# Patient Record
Sex: Female | Born: 1935 | Race: Black or African American | Hispanic: No | Smoking: Former smoker
Health system: Southern US, Community
[De-identification: ages and names within clinical notes are randomized; demographics above are authoritative.]

## PROBLEM LIST (undated history)

## (undated) DIAGNOSIS — G309 Alzheimer's disease, unspecified: Secondary | ICD-10-CM

## (undated) DIAGNOSIS — M1990 Unspecified osteoarthritis, unspecified site: Secondary | ICD-10-CM

## (undated) DIAGNOSIS — F0280 Dementia in other diseases classified elsewhere without behavioral disturbance: Secondary | ICD-10-CM

## (undated) DIAGNOSIS — I10 Essential (primary) hypertension: Secondary | ICD-10-CM

## (undated) HISTORY — PX: CHOLECYSTECTOMY: SHX55

## (undated) HISTORY — PX: BREAST SURGERY: SHX581

---

## 2001-08-05 ENCOUNTER — Other Ambulatory Visit: Admission: RE | Admit: 2001-08-05 | Discharge: 2001-08-05 | Payer: Self-pay | Admitting: Family Medicine

## 2002-09-20 ENCOUNTER — Encounter: Payer: Self-pay | Admitting: Family Medicine

## 2002-09-20 ENCOUNTER — Ambulatory Visit (HOSPITAL_COMMUNITY): Admission: RE | Admit: 2002-09-20 | Discharge: 2002-09-20 | Payer: Self-pay | Admitting: Family Medicine

## 2003-11-13 ENCOUNTER — Ambulatory Visit (HOSPITAL_COMMUNITY): Admission: RE | Admit: 2003-11-13 | Discharge: 2003-11-13 | Payer: Self-pay | Admitting: Family Medicine

## 2004-06-09 ENCOUNTER — Ambulatory Visit (HOSPITAL_COMMUNITY): Admission: RE | Admit: 2004-06-09 | Discharge: 2004-06-09 | Payer: Self-pay | Admitting: Family Medicine

## 2005-07-27 ENCOUNTER — Ambulatory Visit (HOSPITAL_COMMUNITY): Admission: RE | Admit: 2005-07-27 | Discharge: 2005-07-27 | Payer: Self-pay | Admitting: Family Medicine

## 2006-07-29 ENCOUNTER — Ambulatory Visit (HOSPITAL_COMMUNITY): Admission: RE | Admit: 2006-07-29 | Discharge: 2006-07-29 | Payer: Self-pay | Admitting: Family Medicine

## 2007-08-04 ENCOUNTER — Ambulatory Visit (HOSPITAL_COMMUNITY): Admission: RE | Admit: 2007-08-04 | Discharge: 2007-08-04 | Payer: Self-pay | Admitting: Family Medicine

## 2008-08-08 ENCOUNTER — Ambulatory Visit (HOSPITAL_COMMUNITY): Admission: RE | Admit: 2008-08-08 | Discharge: 2008-08-08 | Payer: Self-pay | Admitting: Family Medicine

## 2008-10-11 ENCOUNTER — Ambulatory Visit (HOSPITAL_COMMUNITY): Admission: RE | Admit: 2008-10-11 | Discharge: 2008-10-11 | Payer: Self-pay | Admitting: Gastroenterology

## 2008-10-11 ENCOUNTER — Ambulatory Visit: Payer: Self-pay | Admitting: Gastroenterology

## 2008-10-12 ENCOUNTER — Ambulatory Visit: Payer: Self-pay | Admitting: Gastroenterology

## 2008-10-12 ENCOUNTER — Ambulatory Visit (HOSPITAL_COMMUNITY): Admission: RE | Admit: 2008-10-12 | Discharge: 2008-10-12 | Payer: Self-pay | Admitting: Gastroenterology

## 2008-10-12 ENCOUNTER — Encounter: Payer: Self-pay | Admitting: Gastroenterology

## 2009-04-02 ENCOUNTER — Ambulatory Visit (HOSPITAL_COMMUNITY): Admission: RE | Admit: 2009-04-02 | Discharge: 2009-04-02 | Payer: Self-pay | Admitting: Family Medicine

## 2009-08-01 ENCOUNTER — Ambulatory Visit (HOSPITAL_COMMUNITY): Admission: RE | Admit: 2009-08-01 | Discharge: 2009-08-01 | Payer: Self-pay | Admitting: Family Medicine

## 2009-08-19 ENCOUNTER — Ambulatory Visit (HOSPITAL_COMMUNITY): Admission: RE | Admit: 2009-08-19 | Discharge: 2009-08-19 | Payer: Self-pay | Admitting: Family Medicine

## 2009-12-31 ENCOUNTER — Ambulatory Visit (HOSPITAL_COMMUNITY): Admission: RE | Admit: 2009-12-31 | Discharge: 2009-12-31 | Payer: Self-pay | Admitting: Family Medicine

## 2010-08-21 ENCOUNTER — Ambulatory Visit (HOSPITAL_COMMUNITY): Admission: RE | Admit: 2010-08-21 | Discharge: 2010-08-21 | Payer: Self-pay | Admitting: Family Medicine

## 2011-05-12 NOTE — Op Note (Signed)
[name], [name]                  ACCOUNT NO.:  [id number]   MEDICAL RECORD NO.:  [id number]          PATIENT TYPE:  AMB   LOCATION:  DAY                           FACILITY:  [facility]   PHYSICIAN:  Kassie Mends, M.D.      DATE OF BIRTH:  04/25/1925   DATE OF PROCEDURE:  10/11/2008  DATE OF DISCHARGE:                               OPERATIVE REPORT   REFERRING PHYSICIAN:  Angus G. McInnis, MD   PROCEDURE:  Sigmoidoscopy due to poor bowel prep.   INDICATIONS FOR PROCEDURE:  [name] is an 77 year old female who  presents for average risk colon cancer screening.   FINDINGS:  1. Formed stool seen 30 cm from the anal verge.  2. Frequent large mouth diverticula.  Otherwise no polyps, masses or      inflammatory changes seen.  3. Normal retroflexed view of the rectum.   DIAGNOSES:  Moderate diverticulosis.   RECOMMENDATIONS:  1. She should return for colonoscopy tomorrow after completing an      additional day of MiraLax bowel prep.  2. She should continue to have clear liquid diet for 24 hours and then      nothing after her last dose of MiraLax.  3. She is to take MiraLax 17 grams in 8 ounces of water every hour for      6 hours.  She should wait for 2 hours and then repeat for 4 hours.   MEDICATIONS:  1. Demerol 75 mg IV.  2. Versed 3 mg IV.   PROCEDURE TECHNIQUE:  Physical exam was performed.  Informed consent was  obtained from the patient after explaining benefits, risks and  alternatives to the procedure.  The patient was connected to the monitor  and placed in left lateral position.  Continuous oxygen was provided by  nasal cannula.  IV medicine administered through an indwelling cannula.  After administration of sedation and rectal exam, the patient's rectum  was intubated.  The scope  was advanced under direct visualization to the cecum.  The scope was  removed slowly by carefully examining the color, texture, anatomy and  integrity of the mucosa on the way out.  The  patient was recovered in  endoscopy and discharged home in satisfactory condition.      Kassie Mends, M.D.  Electronically Signed     SM/MEDQ  D:  10/11/2008  T:  10/11/2008  Job:  [id number]   cc:   Angus G. Renard Matter, MD  Fax: [phone]

## 2011-05-12 NOTE — Op Note (Signed)
[name], [name]                  ACCOUNT NO.:  [id number]   MEDICAL RECORD NO.:  [id number]          PATIENT TYPE:  AMB   LOCATION:  DAY                           FACILITY:  [facility]   PHYSICIAN:  Kassie Mends, M.D.      DATE OF BIRTH:  12/24/1925   DATE OF PROCEDURE:  10/12/2008  DATE OF DISCHARGE:                               OPERATIVE REPORT   REFERRING PHYSICIAN:  Angus G. McInnis, MD   PROCEDURE:  Colonoscopy with cold forceps and cold snare polypectomy.   INDICATION FOR EXAM:  [name] is an 77 year old female who has never  had a colonoscopy.  She presents for colon cancer screening.   FINDINGS:  1. A 3-4 mm sessile sigmoid colon polyp removed, via cold snare.  A 6-      mm slightly pedunculated sigmoid colon polyp removed, via snare      cautery.  The base bled after the polyp was removed in piecemeal      fashion with cold forceps initially. She had a moderate amount of      bleeding after the cold forceps biopsies.  The base of the polyp      was snared using cautery, and hemostasis was achieved.  2. Frequent descending colon and sigmoid colon diverticula.  A      thickened fold was seen in the sigmoid colon.  The polyp is less      than 1 cm, could have been easily missed.  The scope was carefully      withdrawn in an attempt to get a comprehensive look at her sigmoid      colon.  Otherwise, no masses, inflammatory changes, or AVMs seen.  3. Normal retroflexed view of the rectum.   DIAGNOSES:  1. Sigmoid colon polyps.  2. Moderate left-sided diverticulosis.   RECOMMENDATIONS:  1. Screening colonoscopy in 10-15 years if she remains healthy.  2. No aspirin until October 20, 2008.  No anti-inflammatory drugs or      anticoagulation for 7 days.  3. She should follow a high-fiber diet.  She was given a handout on      high-fiber diet, polyps, and diverticulosis.   MEDICATIONS:  1. Demerol 50 mg IV.  2. Versed 3 mg IV.   PROCEDURE TECHNIQUE:  Physical exam was  performed.  Informed consent was  obtained from the patient explaining the benefits, risks, and  alternatives to the procedure.  The patient was connected to the monitor  and placed in the left lateral position.  Continuous oxygen was provided  by nasal cannula and IV medicine administered through an indwelling  cannula.  After administration of sedation and rectal exam, the  patient's rectum was intubated and the scope was advanced under direct  visualization to the cecum.  The scope was removed slowly by carefully  examining the color, texture, anatomy, and integrity of the mucosa on  the way out.  The patient was recovered in endoscopy and discharged home  in satisfactory condition.   PATH:  TWO TUBULAR ADENOMAS      Kassie Mends, M.D.  Electronically Signed     SM/MEDQ  D:  10/12/2008  T:  10/13/2008  Job:  [id number]   cc:   Angus G. Renard Matter, MD  Fax: [phone]

## 2011-09-02 ENCOUNTER — Other Ambulatory Visit (HOSPITAL_COMMUNITY): Payer: Self-pay | Admitting: Family Medicine

## 2011-09-02 DIAGNOSIS — Z139 Encounter for screening, unspecified: Secondary | ICD-10-CM

## 2011-09-10 ENCOUNTER — Ambulatory Visit (HOSPITAL_COMMUNITY)
Admission: RE | Admit: 2011-09-10 | Discharge: 2011-09-10 | Disposition: A | Discharge status: Home / Self Care | Payer: Medicare Other | Source: Ambulatory Visit | Attending: Family Medicine | Admitting: Family Medicine

## 2011-09-10 DIAGNOSIS — Z1231 Encounter for screening mammogram for malignant neoplasm of breast: Secondary | ICD-10-CM | POA: Insufficient documentation

## 2011-09-10 DIAGNOSIS — Z139 Encounter for screening, unspecified: Secondary | ICD-10-CM

## 2012-08-04 ENCOUNTER — Other Ambulatory Visit (HOSPITAL_COMMUNITY): Payer: Self-pay | Admitting: Family Medicine

## 2012-08-04 DIAGNOSIS — Z139 Encounter for screening, unspecified: Secondary | ICD-10-CM

## 2012-09-12 ENCOUNTER — Ambulatory Visit (HOSPITAL_COMMUNITY)
Admission: RE | Admit: 2012-09-12 | Discharge: 2012-09-12 | Disposition: A | Discharge status: Home / Self Care | Payer: Medicare Other | Source: Ambulatory Visit | Attending: Family Medicine | Admitting: Family Medicine

## 2012-09-12 DIAGNOSIS — Z139 Encounter for screening, unspecified: Secondary | ICD-10-CM

## 2012-09-12 DIAGNOSIS — Z1231 Encounter for screening mammogram for malignant neoplasm of breast: Secondary | ICD-10-CM | POA: Insufficient documentation

## 2013-09-05 ENCOUNTER — Other Ambulatory Visit (HOSPITAL_COMMUNITY): Payer: Self-pay | Admitting: Family Medicine

## 2013-09-05 DIAGNOSIS — Z139 Encounter for screening, unspecified: Secondary | ICD-10-CM

## 2013-09-14 ENCOUNTER — Ambulatory Visit (HOSPITAL_COMMUNITY)
Admission: RE | Admit: 2013-09-14 | Discharge: 2013-09-14 | Disposition: A | Discharge status: Home / Self Care | Payer: Medicare Other | Source: Ambulatory Visit | Attending: Family Medicine | Admitting: Family Medicine

## 2013-09-14 DIAGNOSIS — Z1231 Encounter for screening mammogram for malignant neoplasm of breast: Secondary | ICD-10-CM | POA: Insufficient documentation

## 2013-09-14 DIAGNOSIS — Z139 Encounter for screening, unspecified: Secondary | ICD-10-CM

## 2015-06-28 ENCOUNTER — Other Ambulatory Visit (HOSPITAL_COMMUNITY): Payer: Self-pay | Admitting: Family Medicine

## 2015-06-28 DIAGNOSIS — Z1231 Encounter for screening mammogram for malignant neoplasm of breast: Secondary | ICD-10-CM

## 2015-07-05 ENCOUNTER — Other Ambulatory Visit (HOSPITAL_COMMUNITY): Payer: Self-pay | Admitting: Family Medicine

## 2015-07-05 ENCOUNTER — Ambulatory Visit (HOSPITAL_COMMUNITY)
Admission: RE | Admit: 2015-07-05 | Discharge: 2015-07-05 | Disposition: A | Discharge status: Home / Self Care | Payer: Medicare Other | Source: Ambulatory Visit | Attending: Family Medicine | Admitting: Family Medicine

## 2015-07-05 DIAGNOSIS — Z1231 Encounter for screening mammogram for malignant neoplasm of breast: Secondary | ICD-10-CM

## 2016-02-12 ENCOUNTER — Emergency Department (HOSPITAL_COMMUNITY)
Admission: EM | Admit: 2016-02-12 | Discharge: 2016-02-12 | Disposition: A | Discharge status: Home / Self Care | Payer: Medicare Other | Attending: Emergency Medicine | Admitting: Emergency Medicine

## 2016-02-12 ENCOUNTER — Encounter (HOSPITAL_COMMUNITY): Payer: Self-pay | Admitting: Emergency Medicine

## 2016-02-12 ENCOUNTER — Emergency Department (HOSPITAL_COMMUNITY): Payer: Medicare Other

## 2016-02-12 DIAGNOSIS — Z87891 Personal history of nicotine dependence: Secondary | ICD-10-CM | POA: Insufficient documentation

## 2016-02-12 DIAGNOSIS — Z9049 Acquired absence of other specified parts of digestive tract: Secondary | ICD-10-CM | POA: Insufficient documentation

## 2016-02-12 DIAGNOSIS — Z791 Long term (current) use of non-steroidal anti-inflammatories (NSAID): Secondary | ICD-10-CM | POA: Diagnosis not present

## 2016-02-12 DIAGNOSIS — F0280 Dementia in other diseases classified elsewhere without behavioral disturbance: Secondary | ICD-10-CM | POA: Insufficient documentation

## 2016-02-12 DIAGNOSIS — K5900 Constipation, unspecified: Secondary | ICD-10-CM | POA: Diagnosis not present

## 2016-02-12 DIAGNOSIS — I10 Essential (primary) hypertension: Secondary | ICD-10-CM | POA: Diagnosis not present

## 2016-02-12 DIAGNOSIS — G309 Alzheimer's disease, unspecified: Secondary | ICD-10-CM | POA: Diagnosis not present

## 2016-02-12 DIAGNOSIS — Z7982 Long term (current) use of aspirin: Secondary | ICD-10-CM | POA: Insufficient documentation

## 2016-02-12 DIAGNOSIS — R52 Pain, unspecified: Secondary | ICD-10-CM

## 2016-02-12 DIAGNOSIS — M1990 Unspecified osteoarthritis, unspecified site: Secondary | ICD-10-CM | POA: Insufficient documentation

## 2016-02-12 DIAGNOSIS — Z79899 Other long term (current) drug therapy: Secondary | ICD-10-CM | POA: Insufficient documentation

## 2016-02-12 DIAGNOSIS — R1032 Left lower quadrant pain: Secondary | ICD-10-CM | POA: Diagnosis present

## 2016-02-12 HISTORY — DX: Unspecified osteoarthritis, unspecified site: M19.90

## 2016-02-12 HISTORY — DX: Alzheimer's disease, unspecified: G30.9

## 2016-02-12 HISTORY — DX: Dementia in other diseases classified elsewhere, unspecified severity, without behavioral disturbance, psychotic disturbance, mood disturbance, and anxiety: F02.80

## 2016-02-12 HISTORY — DX: Essential (primary) hypertension: I10

## 2016-02-12 LAB — COMPREHENSIVE METABOLIC PANEL
ALBUMIN: 3.9 g/dL (ref 3.5–5.0)
ALK PHOS: 95 U/L (ref 38–126)
ALT: 29 U/L (ref 14–54)
AST: 40 U/L (ref 15–41)
Anion gap: 9 (ref 5–15)
BILIRUBIN TOTAL: 0.7 mg/dL (ref 0.3–1.2)
BUN: 40 mg/dL — AB (ref 6–20)
CO2: 29 mmol/L (ref 22–32)
CREATININE: 1.51 mg/dL — AB (ref 0.44–1.00)
Calcium: 9.4 mg/dL (ref 8.9–10.3)
Chloride: 103 mmol/L (ref 101–111)
GFR calc Af Amer: 34 mL/min — ABNORMAL LOW (ref >60)
GFR, EST NON AFRICAN AMERICAN: 29 mL/min — AB (ref >60)
GLUCOSE: 81 mg/dL (ref 65–99)
Potassium: 4.4 mmol/L (ref 3.5–5.1)
Sodium: 141 mmol/L (ref 135–145)
TOTAL PROTEIN: 7.4 g/dL (ref 6.5–8.1)

## 2016-02-12 LAB — CBC WITH DIFFERENTIAL/PLATELET
BASOS ABS: 0 10*3/uL (ref 0.0–0.1)
BASOS PCT: 1 %
Eosinophils Absolute: 0.2 10*3/uL (ref 0.0–0.7)
Eosinophils Relative: 4 %
HEMATOCRIT: 41.4 % (ref 36.0–46.0)
HEMOGLOBIN: 13.2 g/dL (ref 12.0–15.0)
LYMPHS ABS: 2 10*3/uL (ref 0.7–4.0)
LYMPHS PCT: 33 %
MCH: 28.8 pg (ref 26.0–34.0)
MCHC: 31.9 g/dL (ref 30.0–36.0)
MCV: 90.2 fL (ref 78.0–100.0)
MONO ABS: 0.5 10*3/uL (ref 0.1–1.0)
MONOS PCT: 9 %
NEUTROS ABS: 3.2 10*3/uL (ref 1.7–7.7)
Neutrophils Relative %: 53 %
Platelets: 222 10*3/uL (ref 150–400)
RBC: 4.59 MIL/uL (ref 3.87–5.11)
RDW: 13.9 % (ref 11.5–15.5)
WBC: 5.9 10*3/uL (ref 4.0–10.5)

## 2016-02-12 MED ORDER — SODIUM CHLORIDE 0.9 % IV BOLUS (SEPSIS)
500.0000 mL | Freq: Once | INTRAVENOUS | Status: AC
  Administered 2016-02-12: 500 mL via INTRAVENOUS

## 2016-02-12 MED ORDER — DIATRIZOATE MEGLUMINE & SODIUM 66-10 % PO SOLN
ORAL | Status: AC
  Filled 2016-02-12: qty 30

## 2016-02-12 NOTE — ED Notes (Signed)
Pt c/o of generalized abdominal pain and constipation since Monday. Pt's family attempted to give her an enema with no relief. States stool was minimal and hard. Denies n/v/d.
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
## 2016-02-12 NOTE — Discharge Instructions (Signed)
Use a laxative like to clots or milk of magnesia and follow-up with her doctor

## 2016-02-12 NOTE — ED Notes (Signed)
Patient drinking po contrast. Tolerating well. 

## 2016-07-08 ENCOUNTER — Other Ambulatory Visit (HOSPITAL_COMMUNITY): Payer: Self-pay | Admitting: Internal Medicine

## 2016-07-08 DIAGNOSIS — Z780 Asymptomatic menopausal state: Secondary | ICD-10-CM

## 2016-07-13 ENCOUNTER — Ambulatory Visit (HOSPITAL_COMMUNITY)
Admission: RE | Admit: 2016-07-13 | Discharge: 2016-07-13 | Disposition: A | Discharge status: Home / Self Care | Payer: Medicare Other | Source: Ambulatory Visit | Attending: Internal Medicine | Admitting: Internal Medicine

## 2016-07-13 DIAGNOSIS — Z780 Asymptomatic menopausal state: Secondary | ICD-10-CM | POA: Diagnosis not present

## 2017-03-29 ENCOUNTER — Other Ambulatory Visit (HOSPITAL_COMMUNITY): Payer: Self-pay | Admitting: Internal Medicine

## 2017-03-29 DIAGNOSIS — N179 Acute kidney failure, unspecified: Secondary | ICD-10-CM

## 2017-04-05 ENCOUNTER — Ambulatory Visit (HOSPITAL_COMMUNITY): Admission: RE | Admit: 2017-04-05 | Payer: Medicare Other | Source: Ambulatory Visit

## 2017-04-07 ENCOUNTER — Ambulatory Visit (HOSPITAL_COMMUNITY)
Admission: RE | Admit: 2017-04-07 | Discharge: 2017-04-07 | Disposition: A | Discharge status: Home / Self Care | Payer: Medicare Other | Source: Ambulatory Visit | Attending: Internal Medicine | Admitting: Internal Medicine

## 2017-04-07 DIAGNOSIS — R140 Abdominal distension (gaseous): Secondary | ICD-10-CM | POA: Insufficient documentation

## 2017-04-07 DIAGNOSIS — N281 Cyst of kidney, acquired: Secondary | ICD-10-CM | POA: Diagnosis not present

## 2017-04-07 DIAGNOSIS — N179 Acute kidney failure, unspecified: Secondary | ICD-10-CM | POA: Insufficient documentation

## 2017-05-11 ENCOUNTER — Emergency Department (HOSPITAL_COMMUNITY): Payer: Medicare Other

## 2017-05-11 ENCOUNTER — Encounter (HOSPITAL_COMMUNITY): Payer: Self-pay | Admitting: Emergency Medicine

## 2017-05-11 ENCOUNTER — Inpatient Hospital Stay (HOSPITAL_COMMUNITY)
Admission: EM | Admit: 2017-05-11 | Discharge: 2017-05-17 | DRG: 195 | Disposition: A | Discharge status: Skilled Nursing Facility | Payer: Medicare Other | Attending: Internal Medicine | Admitting: Internal Medicine

## 2017-05-11 DIAGNOSIS — Z9049 Acquired absence of other specified parts of digestive tract: Secondary | ICD-10-CM

## 2017-05-11 DIAGNOSIS — N183 Chronic kidney disease, stage 3 (moderate): Secondary | ICD-10-CM | POA: Diagnosis present

## 2017-05-11 DIAGNOSIS — R531 Weakness: Secondary | ICD-10-CM

## 2017-05-11 DIAGNOSIS — R630 Anorexia: Secondary | ICD-10-CM

## 2017-05-11 DIAGNOSIS — Z87891 Personal history of nicotine dependence: Secondary | ICD-10-CM

## 2017-05-11 DIAGNOSIS — J189 Pneumonia, unspecified organism: Secondary | ICD-10-CM | POA: Diagnosis present

## 2017-05-11 DIAGNOSIS — I10 Essential (primary) hypertension: Secondary | ICD-10-CM | POA: Diagnosis present

## 2017-05-11 DIAGNOSIS — R05 Cough: Secondary | ICD-10-CM

## 2017-05-11 DIAGNOSIS — M65812 Other synovitis and tenosynovitis, left shoulder: Secondary | ICD-10-CM | POA: Diagnosis present

## 2017-05-11 DIAGNOSIS — R509 Fever, unspecified: Secondary | ICD-10-CM | POA: Diagnosis not present

## 2017-05-11 DIAGNOSIS — I129 Hypertensive chronic kidney disease with stage 1 through stage 4 chronic kidney disease, or unspecified chronic kidney disease: Secondary | ICD-10-CM | POA: Diagnosis present

## 2017-05-11 DIAGNOSIS — M75122 Complete rotator cuff tear or rupture of left shoulder, not specified as traumatic: Secondary | ICD-10-CM | POA: Diagnosis not present

## 2017-05-11 DIAGNOSIS — Z66 Do not resuscitate: Secondary | ICD-10-CM | POA: Diagnosis present

## 2017-05-11 DIAGNOSIS — E039 Hypothyroidism, unspecified: Secondary | ICD-10-CM | POA: Diagnosis present

## 2017-05-11 DIAGNOSIS — F0280 Dementia in other diseases classified elsewhere without behavioral disturbance: Secondary | ICD-10-CM | POA: Diagnosis present

## 2017-05-11 DIAGNOSIS — M25412 Effusion, left shoulder: Secondary | ICD-10-CM

## 2017-05-11 DIAGNOSIS — E860 Dehydration: Secondary | ICD-10-CM | POA: Diagnosis present

## 2017-05-11 DIAGNOSIS — M719 Bursopathy, unspecified: Secondary | ICD-10-CM | POA: Diagnosis not present

## 2017-05-11 DIAGNOSIS — Z7982 Long term (current) use of aspirin: Secondary | ICD-10-CM | POA: Diagnosis not present

## 2017-05-11 DIAGNOSIS — R059 Cough, unspecified: Secondary | ICD-10-CM

## 2017-05-11 DIAGNOSIS — M25512 Pain in left shoulder: Secondary | ICD-10-CM

## 2017-05-11 DIAGNOSIS — N189 Chronic kidney disease, unspecified: Secondary | ICD-10-CM | POA: Diagnosis present

## 2017-05-11 DIAGNOSIS — Z79899 Other long term (current) drug therapy: Secondary | ICD-10-CM

## 2017-05-11 DIAGNOSIS — M67919 Unspecified disorder of synovium and tendon, unspecified shoulder: Secondary | ICD-10-CM | POA: Diagnosis not present

## 2017-05-11 DIAGNOSIS — G309 Alzheimer's disease, unspecified: Secondary | ICD-10-CM | POA: Diagnosis present

## 2017-05-11 DIAGNOSIS — M1990 Unspecified osteoarthritis, unspecified site: Secondary | ICD-10-CM | POA: Diagnosis present

## 2017-05-11 LAB — CBC WITH DIFFERENTIAL/PLATELET
BASOS PCT: 0 %
Basophils Absolute: 0 10*3/uL (ref 0.0–0.1)
EOS ABS: 0 10*3/uL (ref 0.0–0.7)
EOS PCT: 0 %
HCT: 39 % (ref 36.0–46.0)
HEMOGLOBIN: 12.7 g/dL (ref 12.0–15.0)
Lymphocytes Relative: 7 %
Lymphs Abs: 0.7 10*3/uL (ref 0.7–4.0)
MCH: 28.7 pg (ref 26.0–34.0)
MCHC: 32.6 g/dL (ref 30.0–36.0)
MCV: 88.2 fL (ref 78.0–100.0)
MONO ABS: 1.2 10*3/uL — AB (ref 0.1–1.0)
MONOS PCT: 10 %
NEUTROS PCT: 83 %
Neutro Abs: 9.3 10*3/uL — ABNORMAL HIGH (ref 1.7–7.7)
PLATELETS: 299 10*3/uL (ref 150–400)
RBC: 4.42 MIL/uL (ref 3.87–5.11)
RDW: 14 % (ref 11.5–15.5)
WBC: 11.2 10*3/uL — ABNORMAL HIGH (ref 4.0–10.5)

## 2017-05-11 LAB — COMPREHENSIVE METABOLIC PANEL
ALBUMIN: 3.6 g/dL (ref 3.5–5.0)
ALK PHOS: 66 U/L (ref 38–126)
ALT: 28 U/L (ref 14–54)
ANION GAP: 11 (ref 5–15)
AST: 39 U/L (ref 15–41)
BILIRUBIN TOTAL: 1 mg/dL (ref 0.3–1.2)
BUN: 24 mg/dL — ABNORMAL HIGH (ref 6–20)
CALCIUM: 9.6 mg/dL (ref 8.9–10.3)
CO2: 30 mmol/L (ref 22–32)
CREATININE: 1.53 mg/dL — AB (ref 0.44–1.00)
Chloride: 103 mmol/L (ref 101–111)
GFR calc Af Amer: 33 mL/min — ABNORMAL LOW (ref >60)
GFR calc non Af Amer: 29 mL/min — ABNORMAL LOW (ref >60)
GLUCOSE: 162 mg/dL — AB (ref 65–99)
Potassium: 3.5 mmol/L (ref 3.5–5.1)
Sodium: 144 mmol/L (ref 135–145)
TOTAL PROTEIN: 7.7 g/dL (ref 6.5–8.1)

## 2017-05-11 LAB — LIPASE, BLOOD: Lipase: 28 U/L (ref 11–51)

## 2017-05-11 LAB — URINALYSIS, ROUTINE W REFLEX MICROSCOPIC
Bacteria, UA: NONE SEEN
Bilirubin Urine: NEGATIVE
GLUCOSE, UA: NEGATIVE mg/dL
Ketones, ur: NEGATIVE mg/dL
Leukocytes, UA: NEGATIVE
NITRITE: NEGATIVE
Protein, ur: NEGATIVE mg/dL
SPECIFIC GRAVITY, URINE: 1.013 (ref 1.005–1.030)
pH: 5 (ref 5.0–8.0)

## 2017-05-11 LAB — TROPONIN I
TROPONIN I: 0.03 ng/mL — AB (ref <0.03)
Troponin I: 0.04 ng/mL (ref <0.03)
Troponin I: 0.05 ng/mL (ref <0.03)

## 2017-05-11 LAB — LACTIC ACID, PLASMA: Lactic Acid, Venous: 1.3 mmol/L (ref 0.5–1.9)

## 2017-05-11 MED ORDER — DICLOFENAC SODIUM 1 % TD GEL
TRANSDERMAL | Status: AC
  Filled 2017-05-11: qty 100

## 2017-05-11 MED ORDER — VANCOMYCIN HCL IN DEXTROSE 1-5 GM/200ML-% IV SOLN
1000.0000 mg | Freq: Once | INTRAVENOUS | Status: DC
  Filled 2017-05-11: qty 200

## 2017-05-11 MED ORDER — SODIUM CHLORIDE 0.9 % IV BOLUS (SEPSIS)
500.0000 mL | Freq: Once | INTRAVENOUS | Status: AC
  Administered 2017-05-11: 500 mL via INTRAVENOUS

## 2017-05-11 MED ORDER — MEMANTINE HCL 10 MG PO TABS
5.0000 mg | ORAL_TABLET | Freq: Every day | ORAL | Status: DC
  Administered 2017-05-11 – 2017-05-17 (×6): 5 mg via ORAL
  Filled 2017-05-11 (×6): qty 1

## 2017-05-11 MED ORDER — DICLOFENAC SODIUM 1 % TD GEL
4.0000 g | Freq: Four times a day (QID) | TRANSDERMAL | Status: DC
  Administered 2017-05-11: 4 g via TOPICAL
  Filled 2017-05-11: qty 100

## 2017-05-11 MED ORDER — POTASSIUM CHLORIDE IN NACL 20-0.9 MEQ/L-% IV SOLN
INTRAVENOUS | Status: AC
  Administered 2017-05-11: 22:00:00 via INTRAVENOUS

## 2017-05-11 MED ORDER — LEVOTHYROXINE SODIUM 25 MCG PO TABS
25.0000 ug | ORAL_TABLET | Freq: Every day | ORAL | Status: DC
  Administered 2017-05-12 – 2017-05-17 (×5): 25 ug via ORAL
  Filled 2017-05-11 (×5): qty 1

## 2017-05-11 MED ORDER — HEPARIN SODIUM (PORCINE) 5000 UNIT/ML IJ SOLN
5000.0000 [IU] | Freq: Three times a day (TID) | INTRAMUSCULAR | Status: DC
  Administered 2017-05-11 – 2017-05-17 (×16): 5000 [IU] via SUBCUTANEOUS
  Filled 2017-05-11 (×15): qty 1

## 2017-05-11 MED ORDER — DICLOFENAC SODIUM 1 % TD GEL
2.0000 g | Freq: Four times a day (QID) | TRANSDERMAL | Status: DC
  Administered 2017-05-12 – 2017-05-17 (×16): 2 g via TOPICAL
  Filled 2017-05-11: qty 100

## 2017-05-11 MED ORDER — PIPERACILLIN-TAZOBACTAM 3.375 G IVPB
3.3750 g | Freq: Three times a day (TID) | INTRAVENOUS | Status: DC
  Administered 2017-05-12 – 2017-05-15 (×10): 3.375 g via INTRAVENOUS
  Filled 2017-05-11 (×11): qty 50

## 2017-05-11 MED ORDER — ASPIRIN EC 81 MG PO TBEC
81.0000 mg | DELAYED_RELEASE_TABLET | Freq: Every day | ORAL | Status: DC
  Administered 2017-05-11 – 2017-05-17 (×6): 81 mg via ORAL
  Filled 2017-05-11 (×6): qty 1

## 2017-05-11 MED ORDER — VANCOMYCIN HCL 500 MG IV SOLR
500.0000 mg | INTRAVENOUS | Status: DC
  Administered 2017-05-12 – 2017-05-14 (×3): 500 mg via INTRAVENOUS
  Filled 2017-05-11 (×5): qty 500

## 2017-05-11 MED ORDER — SENNOSIDES-DOCUSATE SODIUM 8.6-50 MG PO TABS: 1.0000 | ORAL_TABLET | Freq: Every evening | ORAL | Status: DC | PRN

## 2017-05-11 MED ORDER — PIPERACILLIN-TAZOBACTAM 3.375 G IVPB 30 MIN
3.3750 g | Freq: Once | INTRAVENOUS | Status: AC
  Administered 2017-05-11: 3.375 g via INTRAVENOUS
  Filled 2017-05-11: qty 50

## 2017-05-11 MED ORDER — SIMVASTATIN 20 MG PO TABS
40.0000 mg | ORAL_TABLET | Freq: Every day | ORAL | Status: DC
  Administered 2017-05-11 – 2017-05-16 (×6): 40 mg via ORAL
  Filled 2017-05-11 (×6): qty 2

## 2017-05-11 MED ORDER — ACETAMINOPHEN 325 MG PO TABS
650.0000 mg | ORAL_TABLET | Freq: Four times a day (QID) | ORAL | Status: DC | PRN
  Administered 2017-05-12 – 2017-05-13 (×2): 650 mg via ORAL
  Filled 2017-05-11 (×2): qty 2

## 2017-05-11 MED ORDER — SODIUM CHLORIDE 0.9 % IV SOLN
INTRAVENOUS | Status: DC
  Administered 2017-05-11 (×2): via INTRAVENOUS

## 2017-05-11 MED ORDER — ACETAMINOPHEN 650 MG RE SUPP: 650.0000 mg | Freq: Four times a day (QID) | RECTAL | Status: DC | PRN
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
## 2017-05-11 NOTE — Progress Notes (Signed)
Pharmacy Antibiotic Note  Autumn DykesJanet M Hayes is a 83 y.o. female admitted on 05/11/2017 with sepsis.  Pharmacy has been consulted for vancomycin and zosyn dosing.  Plan: Vancomycin 1 gm IV x 1 then 500 mg IV q24 hours Zosyn 3.375 gm IV q8 hours F/u renal function, cultures and clinical course  Height: 5\' 3"  (160 cm) Weight: 125 lb (56.7 kg) IBW/kg (Calculated) : 52.4  Temp (24hrs), Avg:98.9 F (37.2 C), Min:97.9 F (36.6 C), Max:100.5 F (38.1 C)   Recent Labs Lab 05/11/17 1220 05/11/17 1255  WBC 11.2*  --   CREATININE 1.53*  --   LATICACIDVEN  --  1.3    Estimated Creatinine Clearance: 20.2 mL/min (A) (by C-G formula based on SCr of 1.53 mg/dL (H)).    No Known Allergies  Thank you for allowing pharmacy to be a part of this patient's care.  Autumn GanjaSeay, [name] 05/11/2017 7:27 PM
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
## 2017-05-11 NOTE — ED Triage Notes (Signed)
Per EMS. Patient from home. Family states patient has refused to eat for past 2 days. Patient has swollen deformed left shoulder that is tender to touch. Patient poor historian.  Patient has history of dementia and falls.

## 2017-05-11 NOTE — ED Notes (Signed)
Patient's family states that patient is at mental baseline. Family states patient has been voiding frequently for past 2 days.

## 2017-05-11 NOTE — ED Notes (Signed)
CRITICAL VALUE ALERT  Critical value received:  Troponin 0.04  Date of notification:  05/11/2017  Time of notification:  1321  Critical value read back:Yes  Nurse who received alert:  Gaetano HawthorneJJ Taishawn Smaldone  MD notified (1st page):  Dr. Deretha EmoryZackowski

## 2017-05-11 NOTE — Discharge Instructions (Signed)
Labs without significant abnormalities. Follow-up with her doctor make an appointment. Return for any new or worse symptoms.

## 2017-05-11 NOTE — ED Notes (Signed)
Lab notified of urine culture order. States there is enough to run culture.

## 2017-05-11 NOTE — ED Notes (Signed)
Patient transported to X-ray 

## 2017-05-11 NOTE — ED Notes (Signed)
In radiology

## 2017-05-12 DIAGNOSIS — I10 Essential (primary) hypertension: Secondary | ICD-10-CM

## 2017-05-12 DIAGNOSIS — M25512 Pain in left shoulder: Secondary | ICD-10-CM

## 2017-05-12 DIAGNOSIS — F0280 Dementia in other diseases classified elsewhere without behavioral disturbance: Secondary | ICD-10-CM

## 2017-05-12 DIAGNOSIS — G309 Alzheimer's disease, unspecified: Secondary | ICD-10-CM

## 2017-05-12 DIAGNOSIS — R509 Fever, unspecified: Secondary | ICD-10-CM

## 2017-05-12 DIAGNOSIS — N183 Chronic kidney disease, stage 3 (moderate): Secondary | ICD-10-CM

## 2017-05-12 LAB — BASIC METABOLIC PANEL
Anion gap: 8 (ref 5–15)
BUN: 25 mg/dL — ABNORMAL HIGH (ref 6–20)
CALCIUM: 8.6 mg/dL — AB (ref 8.9–10.3)
CHLORIDE: 106 mmol/L (ref 101–111)
CO2: 29 mmol/L (ref 22–32)
Creatinine, Ser: 1.42 mg/dL — ABNORMAL HIGH (ref 0.44–1.00)
GFR calc Af Amer: 36 mL/min — ABNORMAL LOW (ref >60)
GFR calc non Af Amer: 31 mL/min — ABNORMAL LOW (ref >60)
GLUCOSE: 127 mg/dL — AB (ref 65–99)
Potassium: 3.4 mmol/L — ABNORMAL LOW (ref 3.5–5.1)
Sodium: 143 mmol/L (ref 135–145)

## 2017-05-12 LAB — CBC
HEMATOCRIT: 35.4 % — AB (ref 36.0–46.0)
HEMOGLOBIN: 11.6 g/dL — AB (ref 12.0–15.0)
MCH: 29 pg (ref 26.0–34.0)
MCHC: 32.8 g/dL (ref 30.0–36.0)
MCV: 88.5 fL (ref 78.0–100.0)
Platelets: 251 10*3/uL (ref 150–400)
RBC: 4 MIL/uL (ref 3.87–5.11)
RDW: 14 % (ref 11.5–15.5)
WBC: 10.7 10*3/uL — ABNORMAL HIGH (ref 4.0–10.5)

## 2017-05-12 LAB — URIC ACID: Uric Acid, Serum: 4.3 mg/dL (ref 2.3–6.6)

## 2017-05-12 LAB — TROPONIN I: Troponin I: 0.05 ng/mL (ref <0.03)

## 2017-05-12 NOTE — Evaluation (Signed)
Clinical/Bedside Swallow Evaluation Patient Details  Name: [name] MRN: [id number] Date of Birth: 1925/09/25  Today's Date: 05/12/2017 Time: SLP Start Time (ACUTE ONLY): 4098 SLP Stop Time (ACUTE ONLY): 0935 SLP Time Calculation (min) (ACUTE ONLY): 31 min  Past Medical History:  Past Medical History:  Diagnosis Date  . Alzheimer's dementia   . Hypertension   . Osteoarthritis    Past Surgical History:  Past Surgical History:  Procedure Laterality Date  . BREAST SURGERY     lump removed  . CHOLECYSTECTOMY     HPI:  [name] a 83 y.o.femalewith medical history significant of Alzheimers dementia, hypertension, osteoarthritis, CKD, who was brought to the ED by EMS, with complaints of weakness for the past 2-3 days. Patient has advanced dementia.The patient's sister notes that for the past 2-3 days patient refuses to get up from the bed, at baseline she is ambulatory. He has also been a change in mood-patient not cooperative and being aggressive. Also notesfever but temperature was not checked. Appetite has been normal but patient at baseline eats only for which she likes. Also at baseline she barely recognizes family members.She would voice discomfort or pain- complaining of left shoulder pain -?acuity and bilat knee pains- chronic. Patient's initially had an appointment to follow-up with her PCP (Dr. Selena Batten) this morning, but patient was too weak to make it to appointment so EMS was called and patient was brought to the ED. In the ED, Patient had a temperature of 100.5, tachycardic- low 100s, white blood count 11.2, lactic acid 1.3, troponin 0.03,0.04, UA unremarkable, chest x-ray negative for acute abnormality, CT head no acute abnormality. The ED patient was given IV fluids 500 ml bolus and maintenance fluid with n/s. Initial plans to discharge patient home, as workup was negative, thenPatient spiked a fever. Hospitalist was therefore called to admit. BSE requested.   Assessment  / Plan / Recommendation Clinical Impression  Clinical swallow evaluation completed at bedside with daughter present. Dtr provided background information due to Pt with h/o dementia. Daughter reports gradual decline in po consumption, but usually willing to drink Ensure/Boost. Pt wears U/L dentures, however only upper dentures are present today (family will be bringing in lower dentures). Pt unable to follow commands consistently to complete oral motor evaluation, however she does occasionally imitate movements and appears to be [facility]. Her tongue is coated from breakfast meal. Pt shows no overt signs or symptoms of aspiration with textures and consistencies presented. She was presented with mech soft textures due to lack of lower dentures present. Dtr denies any coughing or oral residuals at home, just a decline in intake. Recommend downgrade to D3/mech soft and continue thin liquids; straws ok. PO meds whole with water (can do large pills in puree as needed). Standard aspiration and reflux precautions. No further SLP services indicated at this time. Above to RN. Daughter in agreement with plan of care. SLP Visit Diagnosis: Dysphagia, oropharyngeal phase (R13.12)    Aspiration Risk  Mild aspiration risk    Diet Recommendation Dysphagia 3 (Mech soft);Thin liquid   Liquid Administration via: Cup;Straw Medication Administration: Whole meds with liquid Supervision: Staff to assist with self feeding;Full supervision/cueing for compensatory strategies Compensations:  (ensure clear oral cavity after meals/oral care) Postural Changes: Seated upright at 90 degrees;Remain upright for at least 30 minutes after po intake    Other  Recommendations Oral Care Recommendations: Oral care BID;Staff/trained caregiver to provide oral care Other Recommendations: Clarify dietary restrictions   Follow up Recommendations None  Frequency and Duration            Prognosis Prognosis for Safe Diet Advancement:  Good Barriers/Prognosis Comment: Pt without lower dentures at this time      Swallow Study   General Date of Onset: 05/11/17 HPI: Autumn SimasJanet M Coleis a 83 y.o.femalewith medical history significant of Alzheimers dementia, hypertension, osteoarthritis, CKD, who was brought to the ED by EMS, with complaints of weakness for the past 2-3 days. Patient has advanced dementia.The patient's sister notes that for the past 2-3 days patient refuses to get up from the bed, at baseline she is ambulatory. He has also been a change in mood-patient not cooperative and being aggressive. Also notesfever but temperature was not checked. Appetite has been normal but patient at baseline eats only for which she likes. Also at baseline she barely recognizes family members.She would voice discomfort or pain- complaining of left shoulder pain -?acuity and bilat knee pains- chronic. Patient's initially had an appointment to follow-up with her PCP (Dr. Selena BattenKim) this morning, but patient was too weak to make it to appointment so EMS was called and patient was brought to the ED. In the ED, Patient had a temperature of 100.5, tachycardic- low 100s, white blood count 11.2, lactic acid 1.3, troponin 0.03,0.04, UA unremarkable, chest x-ray negative for acute abnormality, CT head no acute abnormality. The ED patient was given IV fluids 500 ml bolus and maintenance fluid with n/s. Initial plans to discharge patient home, as workup was negative, thenPatient spiked a fever. Hospitalist was therefore called to admit. BSE requested. Type of Study: Bedside Swallow Evaluation Previous Swallow Assessment: None on record Diet Prior to this Study: Regular;Thin liquids Temperature Spikes Noted: No Respiratory Status: Room air History of Recent Intubation: No Behavior/Cognition: Alert;Cooperative;Pleasant mood;Requires cueing Oral Cavity Assessment:  (lingual residue (breakfast)) Oral Care Completed by SLP: Yes Oral Cavity - Dentition: Dentures,  top (lower dentures are at home per dtr) Vision: Functional for self-feeding Self-Feeding Abilities: Total assist Patient Positioning: Upright in bed Baseline Vocal Quality: Normal Volitional Cough: Strong Volitional Swallow: Unable to elicit    Oral/Motor/Sensory Function Overall Oral Motor/Sensory Function: Within functional limits (difficult to formally assess due to dementia)   Ice Chips Ice chips: Within functional limits Presentation: Spoon   Thin Liquid Thin Liquid: Within functional limits Presentation: Cup;Straw    Nectar Thick Nectar Thick Liquid: Not tested   Honey Thick Honey Thick Liquid: Not tested   Puree Puree: Within functional limits Presentation: Spoon   Solid   Thank you,  Havery MorosDabney Tashi Andujo, CCC-SLP [phone]    Solid: Within functional limits (mech soft) Presentation: Spoon Other Comments: mild prolonged oral prep (pt without lower dentures)        Oyuki Hogan 05/12/2017,9:49 AM

## 2017-05-12 NOTE — Evaluation (Signed)
Physical Therapy Evaluation Patient Details Name: [name] MRN: [id number] DOB: 14-Oct-1925 Today's Date: 05/12/2017   History of Present Illness  83 y.o. female with medical history significant of Alzheimers dementia, hypertension, osteoarthritis, CKD, who was brought to the ED by EMS, with complaints of weakness for the past 2-3 days. Patient has advanced dementia, history is obtained from daughterDwana Curd, who lives with the patient.  The patient's daughter notes that for the past 2-3 days patient refuses to get up from the bed, at baseline she is ambulatory. He has also been a change in mood-patient not cooperative and being aggressive. Also notes fever but temperature was not checked.  Appetite has been normal but patient at baseline eats only for which she likes. Also at baseline she barely recognizes family members. She would voice discomfort or pain- complaining of left shoulder pain -?acuity and bilat knee pains- chronic.  Clinical Impression   Pt received in bed, dtr present, and pt is agreeable to PT evaluation.  Dtr provided all history, as pt is an unreliable historian.  She has been ambulatory with a cane up until this past week, when she became dependent on using a walker or w/c if she would even move.  During PT evaluation today, she required Max A for transfer supine<>sit.  Multiple attempts were made to get pt to be able to stand, however she was unable to lift buttocks up off the bed due to poor initiation of power up portion of transfer.  She does follow 1-step commands with repetition.  Will place her on a trial of PT for up to 3 visits, however at this point, recommend that she d/c to SNF as pt's dtr and sister can no longer provide the level of care she is requiring.     Follow Up Recommendations SNF    Equipment Recommendations  None recommended by PT    Recommendations for Other Services       Precautions / Restrictions Precautions Precautions: Fall Precaution  Comments: Due to immobility Restrictions Weight Bearing Restrictions: No      Mobility  Bed Mobility Overal bed mobility: Needs Assistance Bed Mobility: Supine to Sit     Supine to sit: HOB elevated;Max assist     General bed mobility comments: Pt demonstrates poor initiation, and requires use of bed pad to assist pt's hips to the EOB.  Once seated at the EOB, pt needs cues to to get feet back in position to attempt standing.  L knee is limited due to arthritic changes.    Transfers                 General transfer comment: Several attemtps made at standing.  Pt will weight shift forward and back in preparation for standing, however demonstrates no initiation for powering up into standing position, therefore buttocks does not leave the bed.    Ambulation/Gait                Stairs            Wheelchair Mobility    Modified Rankin (Stroke Patients Only)       Balance Overall balance assessment: Needs assistance Sitting-balance support: Feet supported;Single extremity supported Sitting balance-Leahy Scale: Good                                       Pertinent Vitals/Pain Pain Assessment: Faces Faces Pain Scale:  Hurts whole lot Pain Location: L shoulder Pain Descriptors / Indicators: Grimacing;Guarding Pain Intervention(s): Limited activity within patient's tolerance;Monitored during session;Repositioned    Home Living   Living Arrangements: Children (Dtr) Available Help at Discharge: Family (pt's sister comes on tuesdays and thursdays when the dtr is at cardiac rehab. ) Type of Home: House Home Access: Ramped entrance     Home Layout: One level Home Equipment: Environmental consultant - 2 wheels;Wheelchair - manual;Cane - single point;Crutches;Shower seat;Bedside commode      Prior Function     Gait / Transfers Assistance Needed: Normally uses a cane up until a week ago, and then needed a great deal of encouragement and RW and w/c to mobilize  since then.    ADL's / Homemaking Assistance Needed: independent with dressing with set up.  Assistance for bathing from aunt.          Hand Dominance        Extremity/Trunk Assessment   Upper Extremity Assessment Upper Extremity Assessment: LUE deficits/detail;RUE deficits/detail RUE Deficits / Details: WFL LUE Deficits / Details: Unable to assess due to pain and guarding.      Lower Extremity Assessment Lower Extremity Assessment:  (B knees with joint enlargement noted and limited AROM due to arthritic changes.  She has B knee flexion contractures, and L knee is limited to ~80* flexion when sitting on EOB )       Communication      Cognition Arousal/Alertness: Awake/alert Behavior During Therapy: Restless Overall Cognitive Status: History of cognitive impairments - at baseline Area of Impairment: Following commands                               General Comments: pt follows 1 step commands with repetition      General Comments      Exercises     Assessment/Plan    PT Assessment  (Trial of PT )  PT Problem List         PT Treatment Interventions      PT Goals (Current goals can be found in the Care Plan section)  Acute Rehab PT Goals Patient Stated Goal: To get stronger PT Goal Formulation: With family Time For Goal Achievement: 05/26/17 Potential to Achieve Goals: Poor    Frequency     Barriers to discharge        Co-evaluation               AM-PAC PT "6 Clicks" Daily Activity  Outcome Measure Difficulty turning over in bed (including adjusting bedclothes, sheets and blankets)?: A Lot Difficulty moving from lying on back to sitting on the side of the bed? : A Lot Difficulty sitting down on and standing up from a chair with arms (e.g., wheelchair, bedside commode, etc,.)?: Total Help needed moving to and from a bed to chair (including a wheelchair)?: Total Help needed walking in hospital room?: Total Help needed climbing 3-5  steps with a railing? : Total 6 Click Score: 8    End of Session Equipment Utilized During Treatment: Gait belt Activity Tolerance: Patient tolerated treatment well Patient left: in bed;with family/visitor present Nurse Communication: Mobility status (mobility sheet left in pt's room. ) PT Visit Diagnosis: Unsteadiness on feet (R26.81);Other abnormalities of gait and mobility (R26.89);Muscle weakness (generalized) (M62.81);Difficulty in walking, not elsewhere classified (R26.2)    Time: 1122-1205 PT Time Calculation (min) (ACUTE ONLY): 43 min   Charges:   PT Evaluation $PT Eval  Low Complexity: 1 Procedure PT Treatments $Therapeutic Activity: 23-37 mins   PT G Codes:   PT G-Codes **NOT FOR INPATIENT CLASS** Functional Assessment Tool Used: AM-PAC 6 Clicks Basic Mobility;Clinical judgement Functional Limitation: Mobility: Walking and moving around Mobility: Walking and Moving Around Current Status ([id number](G8978): At least 80 percent but less than 100 percent impaired, limited or restricted Mobility: Walking and Moving Around Goal Status [phone](G8979): At least 60 percent but less than 80 percent impaired, limited or restricted    Beth Ozzie Remmers, PT, DPT X: [phone]
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
## 2017-05-13 ENCOUNTER — Inpatient Hospital Stay (HOSPITAL_COMMUNITY): Payer: Medicare Other

## 2017-05-13 DIAGNOSIS — M25412 Effusion, left shoulder: Secondary | ICD-10-CM

## 2017-05-13 LAB — URINE CULTURE: Culture: NO GROWTH

## 2017-05-13 MED ORDER — LABETALOL HCL 5 MG/ML IV SOLN
5.0000 mg | INTRAVENOUS | Status: DC | PRN
  Administered 2017-05-13: 5 mg via INTRAVENOUS
  Filled 2017-05-13: qty 4
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
## 2017-05-13 NOTE — Progress Notes (Signed)
At 2134 telemetry called and said pt had a 1.2 sec pause, then returned to normal sinus rhythm. Pt was resting quietly in room, daughter at bedside. Denies any chest pain or SOB. No acute s/s of distress or discomfort.Will continue to monitor.
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
## 2017-05-14 ENCOUNTER — Inpatient Hospital Stay (HOSPITAL_COMMUNITY): Payer: Medicare Other

## 2017-05-14 DIAGNOSIS — M719 Bursopathy, unspecified: Secondary | ICD-10-CM

## 2017-05-14 DIAGNOSIS — M67919 Unspecified disorder of synovium and tendon, unspecified shoulder: Secondary | ICD-10-CM

## 2017-05-14 DIAGNOSIS — M75122 Complete rotator cuff tear or rupture of left shoulder, not specified as traumatic: Secondary | ICD-10-CM

## 2017-05-14 LAB — BASIC METABOLIC PANEL
ANION GAP: 8 (ref 5–15)
BUN: 16 mg/dL (ref 6–20)
CHLORIDE: 104 mmol/L (ref 101–111)
CO2: 25 mmol/L (ref 22–32)
Calcium: 8 mg/dL — ABNORMAL LOW (ref 8.9–10.3)
Creatinine, Ser: 1.52 mg/dL — ABNORMAL HIGH (ref 0.44–1.00)
GFR calc Af Amer: 34 mL/min — ABNORMAL LOW (ref >60)
GFR, EST NON AFRICAN AMERICAN: 29 mL/min — AB (ref >60)
Glucose, Bld: 96 mg/dL (ref 65–99)
POTASSIUM: 3.5 mmol/L (ref 3.5–5.1)
SODIUM: 137 mmol/L (ref 135–145)

## 2017-05-14 LAB — GRAM STAIN: GRAM STAIN: NONE SEEN

## 2017-05-14 LAB — SYNOVIAL CELL COUNT + DIFF, W/ CRYSTALS
CRYSTALS FLUID: NONE SEEN
Eosinophils-Synovial: 0 % (ref 0–1)
LYMPHOCYTES-SYNOVIAL FLD: 7 % (ref 0–20)
MONOCYTE-MACROPHAGE-SYNOVIAL FLUID: 1 % — AB (ref 50–90)
NEUTROPHIL, SYNOVIAL: 92 % — AB (ref 0–25)
WBC, SYNOVIAL: 2940 /mm3 — AB (ref 0–200)

## 2017-05-14 LAB — CBC
HEMATOCRIT: 29.8 % — AB (ref 36.0–46.0)
Hemoglobin: 9.9 g/dL — ABNORMAL LOW (ref 12.0–15.0)
MCH: 28.9 pg (ref 26.0–34.0)
MCHC: 33.2 g/dL (ref 30.0–36.0)
MCV: 87.1 fL (ref 78.0–100.0)
Platelets: 244 10*3/uL (ref 150–400)
RBC: 3.42 MIL/uL — ABNORMAL LOW (ref 3.87–5.11)
RDW: 13.8 % (ref 11.5–15.5)
WBC: 7.1 10*3/uL (ref 4.0–10.5)

## 2017-05-14 MED ORDER — POVIDONE-IODINE 10 % EX SOLN
CUTANEOUS | Status: AC
  Filled 2017-05-14: qty 15

## 2017-05-14 MED ORDER — MORPHINE SULFATE (PF) 2 MG/ML IV SOLN
2.0000 mg | Freq: Once | INTRAVENOUS | Status: AC
  Administered 2017-05-14: 2 mg via INTRAVENOUS
  Filled 2017-05-14: qty 1

## 2017-05-14 MED ORDER — LIDOCAINE HCL (PF) 1 % IJ SOLN
INTRAMUSCULAR | Status: AC
  Administered 2017-05-14: 5 mL
  Filled 2017-05-14: qty 5

## 2017-05-14 MED ORDER — SODIUM CHLORIDE 0.9 % IJ SOLN
INTRAMUSCULAR | Status: AC
  Administered 2017-05-14: 5 mL
  Filled 2017-05-14: qty 10

## 2017-05-14 MED ORDER — IOPAMIDOL (ISOVUE-300) INJECTION 61%
INTRAVENOUS | Status: AC
  Administered 2017-05-14: 4 mL
  Filled 2017-05-14: qty 50

## 2017-05-14 NOTE — Consult Note (Signed)
Reason for Consult: Pain and swelling left shoulder Referring Physician: DR Pearletha Forge  [name] is an 83 y.o. female.   This is a 83 year old female with dementia who presents with new onset acute pain and swelling and warmth left shoulder 2 days prior to admission to the hospital. His was associated with weakness and possible fever. The patient's daughter indicates that the patient has lost the ability to move her left arm and her pain has become severe enough that she complained about it.      Past Medical History:  Diagnosis Date  . Alzheimer's dementia   . Hypertension   . Osteoarthritis     Past Surgical History:  Procedure Laterality Date  . BREAST SURGERY     lump removed  . CHOLECYSTECTOMY      History reviewed. No pertinent family history.  Social History:  reports that she has quit smoking. She has never used smokeless tobacco. She reports that she does not drink alcohol or use drugs.  Allergies: No Known Allergies   Current Facility-Administered Medications:  .  acetaminophen (TYLENOL) tablet 650 mg, 650 mg, Oral, Q6H PRN, 650 mg at 05/13/17 2320 **OR** acetaminophen (TYLENOL) suppository 650 mg, 650 mg, Rectal, Q6H PRN, Emokpae, Ejiroghene E, MD .  aspirin EC tablet 81 mg, 81 mg, Oral, Daily, Emokpae, Ejiroghene E, MD, 81 mg at 05/13/17 1003 .  diclofenac sodium (VOLTAREN) 1 % transdermal gel 2 g, 2 g, Topical, QID, Emokpae, Ejiroghene E, MD, 2 g at 05/13/17 2101 .  heparin injection 5,000 Units, 5,000 Units, Subcutaneous, Q8H, Emokpae, Ejiroghene E, MD, 5,000 Units at 05/14/17 0631 .  iopamidol (ISOVUE-300) 61 % injection, , , ,  .  labetalol (NORMODYNE,TRANDATE) injection 5 mg, 5 mg, Intravenous, Q4H PRN, Rise Patience, MD, 5 mg at 05/13/17 2157 .  levothyroxine (SYNTHROID, LEVOTHROID) tablet 25 mcg, 25 mcg, Oral, QAC breakfast, Emokpae, Ejiroghene E, MD, 25 mcg at 05/13/17 0728 .  lidocaine (PF) (XYLOCAINE) 1 % injection, , , ,  .  memantine (NAMENDA)  tablet 5 mg, 5 mg, Oral, Daily, Emokpae, Ejiroghene E, MD, 5 mg at 05/13/17 1006 .  piperacillin-tazobactam (ZOSYN) IVPB 3.375 g, 3.375 g, Intravenous, Q8H, Emokpae, Ejiroghene E, MD, Stopped at 05/14/17 [phone] .  povidone-iodine (BETADINE) 10 % external solution, , , ,  .  senna-docusate (Senokot-S) tablet 1 tablet, 1 tablet, Oral, QHS PRN, Emokpae, Ejiroghene E, MD .  simvastatin (ZOCOR) tablet 40 mg, 40 mg, Oral, q1800, Emokpae, Ejiroghene E, MD, 40 mg at 05/13/17 1717 .  sodium chloride 0.9 % injection, , , ,  .  vancomycin (VANCOCIN) 500 mg in sodium chloride 0.9 % 100 mL IVPB, 500 mg, Intravenous, Q24H, Emokpae, Ejiroghene E, MD, Last Rate: 100 mL/hr at 05/13/17 1717, 500 mg at 05/13/17 1717 .  vancomycin (VANCOCIN) IVPB 1000 mg/200 mL premix, 1,000 mg, Intravenous, Once, Emokpae, Ejiroghene E, MD   Results for orders placed or performed during the hospital encounter of 05/11/17 (from the past 48 hour(s))  Uric acid     Status: None   Collection Time: 05/12/17  5:40 PM  Result Value Ref Range   Uric Acid, Serum 4.3 2.3 - 6.6 mg/dL  Basic metabolic panel     Status: Abnormal   Collection Time: 05/14/17  4:22 AM  Result Value Ref Range   Sodium 137 135 - 145 mmol/L   Potassium 3.5 3.5 - 5.1 mmol/L   Chloride 104 101 - 111 mmol/L   CO2 25 22 - 32  mmol/L   Glucose, Bld 96 65 - 99 mg/dL   BUN 16 6 - 20 mg/dL   Creatinine, Ser 1.52 (H) 0.44 - 1.00 mg/dL   Calcium 8.0 (L) 8.9 - 10.3 mg/dL   GFR calc non Af Amer 29 (L) >60 mL/min   GFR calc Af Amer 34 (L) >60 mL/min    Comment: (NOTE) The eGFR has been calculated using the CKD EPI equation. This calculation has not been validated in all clinical situations. eGFR's persistently <60 mL/min signify possible Chronic Kidney Disease.    Anion gap 8 5 - 15  CBC     Status: Abnormal   Collection Time: 05/14/17  4:22 AM  Result Value Ref Range   WBC 7.1 4.0 - 10.5 K/uL   RBC 3.42 (L) 3.87 - 5.11 MIL/uL   Hemoglobin 9.9 (L) 12.0 - 15.0 g/dL    HCT 29.8 (L) 36.0 - 46.0 %   MCV 87.1 78.0 - 100.0 fL   MCH 28.9 26.0 - 34.0 pg   MCHC 33.2 30.0 - 36.0 g/dL   RDW 13.8 11.5 - 15.5 %   Platelets 244 150 - 400 K/uL    [place] Lt Upper Extrem Ltd Soft Tissue Non Vascular  Result Date: 05/13/2017 CLINICAL DATA:  Soft tissue swelling and pain in the left shoulder area for 2 days. EXAM: ULTRASOUND left UPPER EXTREMITY LIMITED TECHNIQUE: Ultrasound examination of the upper extremity soft tissues was performed in the area of clinical concern. COMPARISON:  Radiographs 05/11/2017 FINDINGS: There is a fluid collection measuring 3.3 x 1.3 x 2.8 cm. It appears to be deep to the shoulder musculature and deep to the rotator cuff tendons. It could be a joint effusion or possibly bursitis. MRI may be helpful for further evaluation. IMPRESSION: Deep appearing fluid collection possibly a shoulder joint effusion or bursitis. MRI would be helpful for further evaluation. Electronically Signed   By: Marijo Sanes M.D.   On: 05/13/2017 14:04    Review of Systems  Unable to perform ROS: Dementia   Blood pressure 92/69, pulse 74, temperature 99.2 F (37.3 C), temperature source Oral, resp. rate 18, height _0  (1.6 m), weight 125 lb (56.7 kg), SpO2 94 %. Physical Exam Recorded vital signs show low-grade elevation in temperature 99.2. The patient is small and frame but not cachectic. She has some evidence of possible generalized muscle atrophy from disuse. She is disoriented to place and time but oriented to person. Mood and affect seem somnolent and flat  Gait was not assessed as the patient has been on bed rest since admission  1 we look at the left shoulder does warm and swollen there appears to be fluctuant. She has painful range of motion at any attempted flexion abduction or external rotation and holds the arm against her body. The shoulder joint looked reduced stability tests were not reliable because of pain and dementia an understanding of what was asked to  assess. Muscle atrophy is diffuse throughout her body and does not appear to be acute  She has a good pulse in her hand normal color and her sensation is intact she responds to pain pressure. The skin shows no rash. However the shoulder does feel warm to touch  Right shoulder is not swollen passively has 90 of abduction and flexion and normal neurovascular exam skin without rash  Plain films show degenerative arthritis of the shoulder decreased humeral acromial distance but the Shenton's line of the shoulder appears to be intact  I ordered an  ultrasound which showed that the fluid was submuscular so I then ordered a shoulder arthrogram aspiration for further diagnostic purposes   Assessment/Plan: Shoulder effusion with differential diagnosis of infection, bursitis, chronic rotator cuff tear with humeral head escape plus or minus subcutaneous joint effusion     [name] 05/14/2017, 12:04 PM

## 2017-05-14 NOTE — Clinical Social Work Note (Signed)
Clinical Social Work Assessment  Patient Details  Name: [name] MRN: [id number] Date of Birth: 03/13/1925  Date of referral:  05/14/17               Reason for consult:  Discharge Planning                Permission sought to share information with:    Permission granted to share information::     Name::        Agency::     Relationship::     Contact Information:  Autumn BloodgoodVera Hayes, dtr  Housing/Transportation Living arrangements for the past 2 months:  Single Family Home Source of Information:  Adult Children Patient Interpreter Needed:  None Criminal Activity/Legal Involvement Pertinent to Current Situation/Hospitalization:  No - Comment as needed Significant Relationships:  Adult Children Lives with:  Adult Children Do you feel safe going back to the place where you live?  Yes Need for family participation in patient care:  Yes (Comment)  Care giving concerns:  Patient's daughter Autumn CurdVera states that patient's needs are becoming too great and that unless she gets PT she will not be able to provide adequate care for her in the home.    Social Worker assessment / plan:  Patient ambulates with a cane. Her sister completes ADLs with her.  Patient is able to feed herself. Patient's family is agreeable to SNF for patient to return to her baseline to get her to ambulate again with assistive devices.   Employment status:  Retired Database administratornsurance information:  Managed Medicare PT Recommendations:  Not assessed at this time Information / Referral to community resources:  Skilled Nursing Facility  Patient/Family's Response to care: Family is agreeable to SNF.   Patient/Family's Understanding of and Emotional Response to Diagnosis, Current Treatment, and Prognosis:  Family understands patient's diagnosis, prognosis and treatment.   Emotional Assessment Appearance:  Appears stated age Attitude/Demeanor/Rapport:    Affect (typically observed):  Calm Orientation:  Oriented to Self Alcohol /  Substance use:  Not Applicable Psych involvement (Current and /or in the community):  No (Comment)  Discharge Needs  Concerns to be addressed:  Discharge Planning Concerns Readmission within the last 30 days:  No Current discharge risk:  Other (Family says her needs are too great at this time for them to continue providing care in the home for patient. ) Barriers to Discharge:  No Barriers Identified   [name], Autumn Dimalanta D, LCSW 05/14/2017, 1:18 PM

## 2017-05-14 NOTE — Clinical Social Work Placement (Addendum)
   CLINICAL SOCIAL WORK PLACEMENT  NOTE  Date:  05/14/2017  Patient Details  Name: [name] MRN: [id number] Date of Birth: 1925/03/17  Clinical Social Work is seeking post-discharge placement for this patient at the Skilled  Nursing Facility level of care (*CSW will initial, date and re-position this form in  chart as items are completed):  Yes   Patient/family provided with [facility] Social Work Department's list of facilities offering this level of care within the geographic area requested by the patient (or if unable, by the patient's family).  Yes   Patient/family informed of their freedom to choose among providers that offer the needed level of care, that participate in Medicare, Medicaid or managed care program needed by the patient, have an available bed and are willing to accept the patient.  Yes   Patient/family informed of [facility]'s ownership interest [facility] and [facility], as well as of the fact that they are under no obligation to receive care at these facilities.  PASRR submitted to EDS on 05/14/17     PASRR number received on 05/14/17     Existing PASRR number confirmed on       FL2 transmitted to all facilities in geographic area requested by pt/family on     05/17/2017  FL2 transmitted to all facilities within larger geographic area on       Patient informed that his/her managed care company has contracts with or will negotiate with certain facilities, including the following:        Yes   Patient/family informed of bed offers received.  05/17/2017   [facility]     [facility] and [facility]   [facility]  .  05/17/2017   Patient to be transferred to facility by      EMS  Patient family notified on   of transfer. Daughter at bedside   Name of family member notified:      Daughter at bedside.  PHYSICIAN       Additional Comment:     _______________________________________________ [name], Heather D, LCSW 05/14/2017, 1:33 PM

## 2017-05-14 NOTE — Progress Notes (Signed)
Physical Therapy Treatment Patient Details Name: [name] MRN: [id number] DOB: 1925/07/10 Today's Date: 05/14/2017    History of Present Illness 83 y.o. female with medical history significant of Alzheimers dementia, hypertension, osteoarthritis, CKD, who was brought to the ED by EMS, with complaints of weakness for the past 2-3 days. Patient has advanced dementia, history is obtained from daughterDwana Curd- Hayes, who lives with the patient.  The patient's daughter notes that for the past 2-3 days patient refuses to get up from the bed, at baseline she is ambulatory. He has also been a change in mood-patient not cooperative and being aggressive. Also notes fever but temperature was not checked.  Appetite has been normal but patient at baseline eats only for which she likes. Also at baseline she barely recognizes family members. She would voice discomfort or pain- complaining of left shoulder pain -?acuity and bilat knee pains- chronic.  She is s/p L shoulder joint aspiration today (5/18)    PT Comments    Pt received in bed, sister present, and pt is agreeable to PT tx.  Pt demonstrates improved initiation with transfer supine<>sit, however still needs Min A to complete, along with extensive time to complete the task.  Pt is still not able to stand at this time, and demonstrates L shoulder pain.  Continue to recommend SNF due to need for higher level of care.    Follow Up Recommendations  SNF     Equipment Recommendations  None recommended by PT    Recommendations for Other Services       Precautions / Restrictions Precautions Precautions: Fall Precaution Comments: Due to immobility Restrictions Weight Bearing Restrictions: No    Mobility  Bed Mobility Overal bed mobility: Needs Assistance Bed Mobility: Supine to Sit;Sit to Supine     Supine to sit: HOB elevated;Min assist Sit to supine: Total assist   General bed mobility comments: improved initiation with transfer supine<>sit  today.  Pt requires extensive amount of increased time to complete.  Support provided at the trunk, and cues to put feet on the floor.  Minimal use of bed pad to assist with scooting pt's hips to the EOB.  She continues to require Total A to return to supine position  Transfers                 General transfer comment: Several attempts made to attempt standing, however pt does not initiate transfer, and when therapist attempted to assist her up into standing positin, she resists.    Ambulation/Gait                 Stairs            Wheelchair Mobility    Modified Rankin (Stroke Patients Only)       Balance Overall balance assessment: Needs assistance Sitting-balance support: Feet supported;Single extremity supported Sitting balance-Leahy Scale: Good                                      Cognition Arousal/Alertness: Awake/alert Behavior During Therapy: Impulsive (Easily distracted)                                   General Comments: pt follows 1 step commands with repetition and with visual/tactile cues.        Exercises General Exercises - Upper Extremity Shoulder Flexion:  AAROM;Left;5 reps;Limitations Shoulder Flexion Limitations: Very limited range due to pain Shoulder ADduction: AAROM;Left;5 reps;Limitations Shoulder Adduction Limitations: Very limited range due to pain Elbow Flexion: Left;5 reps;Seated    General Comments        Pertinent Vitals/Pain Pain Assessment: Faces Faces Pain Scale: Hurts even more Pain Location: L shoulder Pain Descriptors / Indicators: Guarding;Grimacing    Home Living                      Prior Function            PT Goals (current goals can now be found in the care plan section) Acute Rehab PT Goals Patient Stated Goal: To get stronger PT Goal Formulation: With family Time For Goal Achievement: 05/26/17 Potential to Achieve Goals: Poor Progress towards PT goals:  Progressing toward goals    Frequency    Min 3X/week      PT Plan Frequency needs to be updated    Co-evaluation              AM-PAC PT "6 Clicks" Daily Activity  Outcome Measure  Difficulty turning over in bed (including adjusting bedclothes, sheets and blankets)?: A Lot Difficulty moving from lying on back to sitting on the side of the bed? : A Lot Difficulty sitting down on and standing up from a chair with arms (e.g., wheelchair, bedside commode, etc,.)?: Total Help needed moving to and from a bed to chair (including a wheelchair)?: Total Help needed walking in hospital room?: Total Help needed climbing 3-5 steps with a railing? : Total 6 Click Score: 8    End of Session Equipment Utilized During Treatment: Gait belt Activity Tolerance: Patient tolerated treatment well Patient left: in bed;with family/visitor present Nurse Communication: Mobility status PT Visit Diagnosis: Unsteadiness on feet (R26.81);Other abnormalities of gait and mobility (R26.89);Muscle weakness (generalized) (M62.81);Difficulty in walking, not elsewhere classified (R26.2)     Time: 1430-1500 PT Time Calculation (min) (ACUTE ONLY): 30 min  Charges:  $Therapeutic Activity: 23-37 mins                    G Codes:       Beth Jonise Weightman, PT, DPT X: [phone]

## 2017-05-14 NOTE — Progress Notes (Signed)
Spoke with Rene KocherRegina in Radiology about the scheduled procedure this am.  Voiced to her that the patient was on Heparin  And received a dose this am around 0630.  She stated that she would talk with the Radiologist.  After discussing the situation with Dr. Llana AlimentEntrikin he stated that it would be okay to proceed with the procedure.  I discussed with the patients daughter the POC she verbalized understanding.  Rene KocherRegina voiced to me what should be on the consent. Discussed with Dr. Kerry HoughMemon that the patient would need some type of sedation due to her dementia she may not be able to stay still.  New orders given and followed.

## 2017-05-14 NOTE — Progress Notes (Signed)
No ointment placed per daughter request patient was tired after her procedure.

## 2017-05-14 NOTE — Progress Notes (Signed)
Notified Dr. Kerry HoughMemon of the patients chest xray.

## 2017-05-14 NOTE — Progress Notes (Signed)
Patient ID: Autumn DykesJanet M Schaus, female   DOB: 1925/11/14, 83 y.o.   MRN: [id number] The fluid collection appears to be deep to the musculature and subcutaneous aspiration is not possible.  Recommend fluoroscopically guided aspiration culture sensitivity and cell count and crystal analysis of fluid.

## 2017-05-14 NOTE — Care Management Important Message (Signed)
Important Message  Patient Details  Name: Autumn DykesJanet M Hayes MRN: [id number] Date of Birth: [birth year]   Medicare Important Message Given:  Yes    Kaleeah Gingerich, Chrystine OilerSharley Diane, RN 05/14/2017, 8:45 AM

## 2017-05-14 NOTE — Progress Notes (Signed)
PROGRESS NOTE    [name]  WUJ:[id number] DOB: 1925/05/12 DOA: 05/11/2017 PCP: [name], MD    Brief Narrative:  83 year old female with a history of dementia, chronic kidney disease stage III, presents to the hospital with generalized weakness, low-grade fevers. She does not have any obvious source of fever at this time. Started on intravenous antibiotics and cultures have been sent. Continue to monitor.   Assessment & Plan:   Principal Problem:   Weakness Active Problems:   Alzheimer's dementia   Osteoarthritis   Fever   CKD (chronic kidney disease)   HTN (hypertension)   1. Fever of unknown etiology. Chest x-ray does not show any pneumonia. Urinalysis is also unremarkable. Possible related to bursitis/joint pain. She has been started on intravenous antibiotics. Urine and Blood cultures have shown no growth. We'll plan to discontinue further antibiotics once issues with shoulder have been addressed. Since she has also been coughing, will repeat chest xray. Continue to monitor 2. Left shoulder pain. Uric acid is normal. Orthopedics following. She has undergone joint aspiration today with studies pending. 3. Chronic kidney disease stage III. Creatinine appears to be her baseline. Continue to monitor. 4. Hypertension. Blood pressures are running low. Amlodipine on hold. Continue to monitor 5. Generalized weakness. Seen by physical therapy who recommended skilled nursing facility placement. 6. Hypothyroidism. Continue on Synthroid.  7. Dementia. Continue on Namenda.   DVT prophylaxis:  heparin Code Status: DNR Family Communication: discussed with daughter at the bedside Disposition Plan: seen by physical therapy who recommended SNF placement   Consultants:   Orthopedics  Procedures:   5/18 Aspiration of left shoulder by radiology  Antimicrobials:   Vancomycin 5/15>>  Zosyn 5/15>>   Subjective: Daughter reports that patient has been coughing overnight. She did  have a fever overnight  Objective: Vitals:   05/13/17 2100 05/13/17 2300 05/14/17 0108 05/14/17 0500  BP:  (!) 112/47 (!) 106/58 92/69  Pulse: (!) 118 79 86 74  Resp: 18   18  Temp: (!) 101.2 F (38.4 C)  98.2 F (36.8 C) 99.2 F (37.3 C)  TempSrc: Axillary  Oral Oral  SpO2:   94%   Weight:      Height:        Intake/Output Summary (Last 24 hours) at 05/14/17 1247 Last data filed at 05/14/17 1220  Gross per 24 hour  Intake               50 ml  Output                6 ml  Net               44 ml   Filed Weights   05/11/17 1123  Weight: 56.7 kg (125 lb)    Examination:  General exam: Alert, awake, no distress Respiratory system: Clear to auscultation. Respiratory effort normal. Cardiovascular system:RRR. No murmurs, rubs, gallops. Gastrointestinal system: Abdomen is nondistended, soft and nontender. No organomegaly or masses felt. Normal bowel sounds heard. Central nervous system: Alert and oriented. No focal neurological deficits. Extremities: left shoulder is warm and tender to touch. decreased range of motion Skin: No rashes, lesions or ulcers Psychiatry: confused, pleasant      Data Reviewed: I have personally reviewed following labs and imaging studies  CBC:  Recent Labs Lab 05/11/17 1220 05/12/17 0207 05/14/17 0422  WBC 11.2* 10.7* 7.1  NEUTROABS 9.3*  --   --   HGB 12.7 11.6* 9.9*  HCT 39.0 35.4* 29.8*  MCV 88.2 88.5 87.1  PLT 299 251 244   Basic Metabolic Panel:  Recent Labs Lab 05/11/17 1220 05/12/17 0207 05/14/17 0422  NA 144 143 137  K 3.5 3.4* 3.5  CL 103 106 104  CO2 30 29 25   GLUCOSE 162* 127* 96  BUN 24* 25* 16  CREATININE 1.53* 1.42* 1.52*  CALCIUM 9.6 8.6* 8.0*   GFR: Estimated Creatinine Clearance: 20.3 mL/min (A) (by C-G formula based on SCr of 1.52 mg/dL (H)). Liver Function Tests:  Recent Labs Lab 05/11/17 1220  AST 39  ALT 28  ALKPHOS 66  BILITOT 1.0  PROT 7.7  ALBUMIN 3.6    Recent Labs Lab  05/11/17 1220  LIPASE 28   No results for input(s): AMMONIA in the last 168 hours. Coagulation Profile: No results for input(s): INR, PROTIME in the last 168 hours. Cardiac Enzymes:  Recent Labs Lab 05/11/17 1220 05/11/17 1405 05/11/17 1959 05/12/17 0207  TROPONINI 0.04* 0.03* 0.05* 0.05*   BNP (last 3 results) No results for input(s): PROBNP in the last 8760 hours. HbA1C: No results for input(s): HGBA1C in the last 72 hours. CBG: No results for input(s): GLUCAP in the last 168 hours. Lipid Profile: No results for input(s): CHOL, HDL, LDLCALC, TRIG, CHOLHDL, LDLDIRECT in the last 72 hours. Thyroid Function Tests: No results for input(s): TSH, T4TOTAL, FREET4, T3FREE, THYROIDAB in the last 72 hours. Anemia Panel: No results for input(s): VITAMINB12, FOLATE, FERRITIN, TIBC, IRON, RETICCTPCT in the last 72 hours. Sepsis Labs:  Recent Labs Lab 05/11/17 1255  LATICACIDVEN 1.3    Recent Results (from the past 240 hour(s))  Culture, blood (Routine X 2) w Reflex to ID Panel     Status: None (Preliminary result)   Collection Time: 05/11/17  4:49 PM  Result Value Ref Range Status   Specimen Description BLOOD RIGHT HAND  Final   Special Requests   Final    BOTTLES DRAWN AEROBIC AND ANAEROBIC Blood Culture results may not be optimal due to an inadequate volume of blood received in culture bottles   Culture NO GROWTH 3 DAYS  Final   Report Status PENDING  Incomplete  Culture, blood (Routine X 2) w Reflex to ID Panel     Status: None (Preliminary result)   Collection Time: 05/11/17  4:49 PM  Result Value Ref Range Status   Specimen Description BLOOD RIGHT HAND  Final   Special Requests   Final    BOTTLES DRAWN AEROBIC AND ANAEROBIC Blood Culture results may not be optimal due to an inadequate volume of blood received in culture bottles   Culture NO GROWTH 3 DAYS  Final   Report Status PENDING  Incomplete  Culture, Urine     Status: None   Collection Time: 05/11/17  4:49 PM   Result Value Ref Range Status   Specimen Description URINE, CATHETERIZED  Final   Special Requests NONE  Final   Culture   Final    NO GROWTH Performed at [facility], [address] [place], [place] [address]    Report Status 05/13/2017 FINAL  Final         Radiology Studies: Dg Fluoro Guided Needle Plc Aspiration/injection Loc  Result Date: 05/14/2017 CLINICAL DATA:  83 year old female with left shoulder pain and swelling EXAM: LEFT SHOULDER ASPIRATION UNDER FLUOROSCOPY TECHNIQUE: An appropriate skin entrance site was determined. The site was marked, prepped with Betadine, draped in the usual sterile fashion, and infiltrated locally with buffered Lidocaine. 22 gauge spinal needle  was advanced to the superomedial margin of the humeral head under intermittent fluoroscopy. 2 ml of Isovue 300 was injected to confirm placement in the joint space. Subsequently 3 cc sterile saline was injected into the joint space and aspirated. FLUOROSCOPY TIME:  Fluoroscopy Time:  1 minutes and 30 seconds FINDINGS: Yellowish fluid was aspirated from the joint space and sent to the laboratory for testing and analysis per orders of the primary medical team. IMPRESSION: Technically successful left shoulder aspiration. Electronically Signed   By: Trudie Reed M.D.   On: 05/14/2017 12:29   [place] Lt Upper Extrem Ltd Soft Tissue Non Vascular  Result Date: 05/13/2017 CLINICAL DATA:  Soft tissue swelling and pain in the left shoulder area for 2 days. EXAM: ULTRASOUND left UPPER EXTREMITY LIMITED TECHNIQUE: Ultrasound examination of the upper extremity soft tissues was performed in the area of clinical concern. COMPARISON:  Radiographs 05/11/2017 FINDINGS: There is a fluid collection measuring 3.3 x 1.3 x 2.8 cm. It appears to be deep to the shoulder musculature and deep to the rotator cuff tendons. It could be a joint effusion or possibly bursitis. MRI may be helpful for further evaluation. IMPRESSION: Deep  appearing fluid collection possibly a shoulder joint effusion or bursitis. MRI would be helpful for further evaluation. Electronically Signed   By: Rudie Meyer M.D.   On: 05/13/2017 14:04        Scheduled Meds: . aspirin EC  81 mg Oral Daily  . diclofenac sodium  2 g Topical QID  . heparin  5,000 Units Subcutaneous Q8H  . levothyroxine  25 mcg Oral QAC breakfast  . memantine  5 mg Oral Daily  . simvastatin  40 mg Oral q1800   Continuous Infusions: . piperacillin-tazobactam (ZOSYN)  IV Stopped (05/14/17 0454)  . vancomycin 500 mg (05/13/17 1717)  . vancomycin       LOS: 3 days    Time spent: 25 minutes    Olympia Adelsberger, MD [facility] Pager [phone]  If 7PM-7AM, please contact night-coverage [email or web address] Password [facility] 05/14/2017, 12:47 PM

## 2017-05-14 NOTE — Progress Notes (Signed)
Patient ID: Autumn DykesJanet M Hayes, female   DOB: [birth year], 83 y.o.   MRN: [id number] Please refer to lab results   No evidence of infection   We can treat with oral nsaids   Heat (k pad ) 20 min q6 hrs   Sling

## 2017-05-14 NOTE — Care Management Note (Signed)
Case Management Note  Patient Details  Name: Autumn DykesJanet M Hayes MRN: [id number] Date of Birth: [birth year]  Subjective/Objective:  Adm with weakness. From home with daughter.  Shoulder pain workup in process. Recommended for SNF. Family agreeable.                  Action/Plan: CSW aware and making arrangements for placement. CM will follow.   Expected Discharge Date:                  Expected Discharge Plan:  Skilled Nursing Facility  In-House Referral:  Clinical Social Work  Discharge planning Services  CM Consult  Post Acute Care Choice:    Choice offered to:     DME Arranged:    DME Agency:     HH Arranged:    HH Agency:     Status of Service:  In process, will continue to follow  If discussed at Long Length of Stay Meetings, dates discussed:    Additional Comments:  Odie Rauen, Chrystine OilerSharley Diane, RN 05/14/2017, 9:55 AM

## 2017-05-14 NOTE — [place] FL2 (Signed)
[place] MEDICAID FL2 LEVEL OF CARE SCREENING TOOL     IDENTIFICATION  Patient Name: [name] Birthdate: 10-04-1925 Sex: female Admission Date (Current Location): 05/11/2017  [place] and [place] Number:  Aaron Edelman [id number] [address] Facility and Address:  [facility],  [address], [place] [address]      Provider Number: [id number]  Attending Physician Name and Address:  Erick Blinks, MD  Relative Name and Phone Number:       Current Level of Care: Hospital Recommended Level of Care: Skilled Nursing Facility Prior Approval Number: [id number]  Date Approved/Denied:   PASRR Number:    Discharge Plan: SNF    Current Diagnoses: Patient Active Problem List   Diagnosis Date Noted  . Weakness 05/11/2017  . Alzheimer's dementia 05/11/2017  . Osteoarthritis 05/11/2017  . Fever 05/11/2017  . CKD (chronic kidney disease) 05/11/2017  . HTN (hypertension) 05/11/2017    Orientation RESPIRATION BLADDER Height & Weight     Self  Normal Incontinent Weight: 125 lb (56.7 kg) Height:  5\' 3"  (160 cm)  BEHAVIORAL SYMPTOMS/MOOD NEUROLOGICAL BOWEL NUTRITION STATUS      Incontinent Diet (Diet Heart Healthy)  AMBULATORY STATUS COMMUNICATION OF NEEDS Skin   Extensive Assist Verbally Normal                       Personal Care Assistance Level of Assistance  Bathing, Feeding, Dressing Bathing Assistance: Limited assistance Feeding assistance: Limited assistance Dressing Assistance: Limited assistance     Functional Limitations Info  Sight, Hearing, Speech Sight Info: Adequate Hearing Info: Adequate Speech Info: Adequate    SPECIAL CARE FACTORS FREQUENCY  PT (By licensed PT)     PT Frequency: 5x/week              Contractures Contractures Info: Not present    Additional Factors Info  Code Status Code Status Info: DNR             Current Medications (05/14/2017):  This is the current hospital active medication list Current  Facility-Administered Medications  Medication Dose Route Frequency Provider Last Rate Last Dose  . acetaminophen (TYLENOL) tablet 650 mg  650 mg Oral Q6H PRN Emokpae, Ejiroghene E, MD   650 mg at 05/13/17 2320   Or  . acetaminophen (TYLENOL) suppository 650 mg  650 mg Rectal Q6H PRN Emokpae, Ejiroghene E, MD      . aspirin EC tablet 81 mg  81 mg Oral Daily Emokpae, Ejiroghene E, MD   81 mg at 05/13/17 1003  . diclofenac sodium (VOLTAREN) 1 % transdermal gel 2 g  2 g Topical QID Emokpae, Ejiroghene E, MD   2 g at 05/13/17 2101  . heparin injection 5,000 Units  5,000 Units Subcutaneous Q8H Emokpae, Ejiroghene E, MD   5,000 Units at 05/14/17 0631  . labetalol (NORMODYNE,TRANDATE) injection 5 mg  5 mg Intravenous Q4H PRN Eduard Clos, MD   5 mg at 05/13/17 2157  . levothyroxine (SYNTHROID, LEVOTHROID) tablet 25 mcg  25 mcg Oral QAC breakfast Emokpae, Ejiroghene E, MD   25 mcg at 05/13/17 0728  . memantine (NAMENDA) tablet 5 mg  5 mg Oral Daily Emokpae, Ejiroghene E, MD   5 mg at 05/13/17 1006  . piperacillin-tazobactam (ZOSYN) IVPB 3.375 g  3.375 g Intravenous Q8H Emokpae, Ejiroghene E, MD   Stopped at 05/14/17 [phone]  . senna-docusate (Senokot-S) tablet 1 tablet  1 tablet Oral QHS PRN Emokpae, Ejiroghene E, MD      .  simvastatin (ZOCOR) tablet 40 mg  40 mg Oral q1800 Emokpae, Ejiroghene E, MD   40 mg at 05/13/17 1717  . vancomycin (VANCOCIN) 500 mg in sodium chloride 0.9 % 100 mL IVPB  500 mg Intravenous Q24H Emokpae, Ejiroghene E, MD 100 mL/hr at 05/13/17 1717 500 mg at 05/13/17 1717  . vancomycin (VANCOCIN) IVPB 1000 mg/200 mL premix  1,000 mg Intravenous Once Emokpae, Ejiroghene E, MD         Discharge Medications: Please see discharge summary for a list of discharge medications.  Relevant Imaging Results:  Relevant Lab Results:   Additional Information SSN [id number][id number]   Ella Golomb, Juleen ChinaHeather D, LCSW

## 2017-05-15 DIAGNOSIS — J189 Pneumonia, unspecified organism: Principal | ICD-10-CM

## 2017-05-15 LAB — BASIC METABOLIC PANEL
ANION GAP: 8 (ref 5–15)
BUN: 18 mg/dL (ref 6–20)
CALCIUM: 8.6 mg/dL — AB (ref 8.9–10.3)
CO2: 27 mmol/L (ref 22–32)
Chloride: 103 mmol/L (ref 101–111)
Creatinine, Ser: 1.46 mg/dL — ABNORMAL HIGH (ref 0.44–1.00)
GFR calc Af Amer: 35 mL/min — ABNORMAL LOW (ref >60)
GFR, EST NON AFRICAN AMERICAN: 30 mL/min — AB (ref >60)
GLUCOSE: 111 mg/dL — AB (ref 65–99)
POTASSIUM: 4.3 mmol/L (ref 3.5–5.1)
SODIUM: 138 mmol/L (ref 135–145)

## 2017-05-15 LAB — CBC
HCT: 35.7 % — ABNORMAL LOW (ref 36.0–46.0)
Hemoglobin: 11.6 g/dL — ABNORMAL LOW (ref 12.0–15.0)
MCH: 28.6 pg (ref 26.0–34.0)
MCHC: 32.5 g/dL (ref 30.0–36.0)
MCV: 87.9 fL (ref 78.0–100.0)
PLATELETS: 300 10*3/uL (ref 150–400)
RBC: 4.06 MIL/uL (ref 3.87–5.11)
RDW: 13.9 % (ref 11.5–15.5)
WBC: 6.2 10*3/uL (ref 4.0–10.5)

## 2017-05-15 MED ORDER — IPRATROPIUM-ALBUTEROL 0.5-2.5 (3) MG/3ML IN SOLN
3.0000 mL | Freq: Four times a day (QID) | RESPIRATORY_TRACT | Status: DC
  Administered 2017-05-16 – 2017-05-17 (×6): 3 mL via RESPIRATORY_TRACT
  Filled 2017-05-15 (×6): qty 3

## 2017-05-15 MED ORDER — LEVOFLOXACIN 750 MG PO TABS
750.0000 mg | ORAL_TABLET | Freq: Every day | ORAL | Status: DC
  Administered 2017-05-15: 750 mg via ORAL
  Filled 2017-05-15: qty 1

## 2017-05-15 MED ORDER — GUAIFENESIN ER 600 MG PO TB12
1200.0000 mg | ORAL_TABLET | Freq: Two times a day (BID) | ORAL | Status: DC
Start: 2017-05-15 — End: 2017-05-17
  Administered 2017-05-15 – 2017-05-17 (×4): 1200 mg via ORAL
  Filled 2017-05-15 (×4): qty 2

## 2017-05-15 MED ORDER — IPRATROPIUM-ALBUTEROL 0.5-2.5 (3) MG/3ML IN SOLN
3.0000 mL | Freq: Four times a day (QID) | RESPIRATORY_TRACT | Status: DC
  Administered 2017-05-15: 3 mL via RESPIRATORY_TRACT
  Filled 2017-05-15: qty 3

## 2017-05-15 NOTE — Progress Notes (Signed)
Pharmacy Antibiotic Note  Autumn DykesJanet M Hayes is Hayes 83 y.o. female admitted on 05/11/2017 with sepsis.  Pharmacy has been consulted for vancomycin and zosyn dosing.  Currently afebrile.  SCr stable.   Plan: Vancomycin 500 mg IV q24 hours Zosyn 3.375 gm IV q8 hours F/u renal function, cultures and clinical course  Height: 5\' 3"  (160 cm) Weight: 125 lb (56.7 kg) IBW/kg (Calculated) : 52.4  Temp (24hrs), Avg:98.5 F (36.9 C), Min:98.3 F (36.8 C), Max:98.6 F (37 C)   Recent Labs Lab 05/11/17 1220 05/11/17 1255 05/12/17 0207 05/14/17 0422 05/15/17 0611  WBC 11.2*  --  10.7* 7.1 6.2  CREATININE 1.53*  --  1.42* 1.52* 1.46*  LATICACIDVEN  --  1.3  --   --   --     Estimated Creatinine Clearance: 21.2 mL/min (Hayes) (by C-G formula based on SCr of 1.46 mg/dL (H)).    No Known Allergies  Thank you for allowing pharmacy to be Hayes part of this patient's care.  [name], [name] 05/15/2017 8:37 AM
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
## 2017-05-16 LAB — CULTURE, BLOOD (ROUTINE X 2)
Culture: NO GROWTH
Culture: NO GROWTH

## 2017-05-16 MED ORDER — LEVOFLOXACIN 750 MG PO TABS
750.0000 mg | ORAL_TABLET | ORAL | Status: DC
Start: 2017-05-17 — End: 2017-05-17

## 2017-05-16 MED ORDER — PREDNISONE 20 MG PO TABS
40.0000 mg | ORAL_TABLET | Freq: Every day | ORAL | Status: DC
  Administered 2017-05-16 – 2017-05-17 (×2): 40 mg via ORAL
  Filled 2017-05-16 (×2): qty 2

## 2017-05-16 NOTE — Progress Notes (Signed)
PHARMACY NOTE:  ANTIMICROBIAL RENAL DOSAGE ADJUSTMENT  Current antimicrobial regimen includes a mismatch between antimicrobial dosage and estimated renal function.  As per policy approved by the Pharmacy & Therapeutics and Medical Executive Committees, the antimicrobial dosage will be adjusted accordingly.  Current antimicrobial dosage:  Levaquin 750mg  po daily  Indication: Fever of unknown origin, cx's (-)  Renal Function: Estimated Creatinine Clearance: 21.2 mL/min (A) (by C-G formula based on SCr of 1.46 mg/dL (H)).    Antimicrobial dosage has been changed to:  Levaquin 750mg  PO q48hrs  Additional comments:  Monitor labs, SCR, progress  Thank you for allowing pharmacy to be a part of this patient's care.  Wayland DenisHall, Shelanda Duvall A, [facility]PH 05/16/2017 8:09 AM

## 2017-05-16 NOTE — Progress Notes (Signed)
PROGRESS NOTE    [name]  YQM:[id number] DOB: September 23, 1925 DOA: 05/11/2017 PCP: [name], MD    Brief Narrative:  83 year old female with a history of dementia, chronic kidney disease stage III, presents to the hospital with generalized weakness, low-grade fevers. She does not have any obvious source of fever at this time. Started on intravenous antibiotics and cultures have been sent. Continue to monitor.   Assessment & Plan:   Principal Problem:   Weakness Active Problems:   Alzheimer's dementia   Osteoarthritis   Fever   CKD (chronic kidney disease)   HTN (hypertension)   CAP (community acquired pneumonia)   1. Fever of unknown etiology. Initial chest x-ray did not show any pneumonia, but follow up CXR did indicate developing PNA. Urinalysis is unremarkable. Possible related to bursitis/joint pain. Urine and Blood cultures have shown no growth. Fevers appear to be resolving. 2. Left shoulder pain. Uric acid is normal. Orthopedics following. She underwent joint aspiration which did not show any evidence of infection or gout. Discussed with Dr. Romeo Apple and likely an inflammatory synovitis. Recommendations were for supportive management with the sling. Will avoid NSAIDs due to chronic kidney disease. Dr. Romeo Apple agreed with the course of prednisone. 3. CAP. Treated with intravenous vancomycin and zosyn. Now on levaquin. Continue to monitor fever curve. 4. Chronic kidney disease stage III. Creatinine appears to be her baseline. Continue to monitor. 5. Hypertension. Blood pressures are running low. Amlodipine on hold. Continue to monitor 6. Generalized weakness. Seen by physical therapy who recommended skilled nursing facility placement. 7. Hypothyroidism. Continue on Synthroid.  8. Dementia. Continue on Namenda.   DVT prophylaxis:  heparin Code Status: DNR Family Communication: discussed with daughter at the bedside Disposition Plan: seen by physical therapy who recommended  SNF placement, probable discharge in a.m.   Consultants:   Orthopedics  Procedures:   5/18 Aspiration of left shoulder by radiology  Antimicrobials:   Vancomycin 5/15>>5/19  Zosyn 5/15>>5/19  levaquin 5/19>>   Subjective: No fever overnight. Confused.  Objective: Vitals:   05/16/17 0146 05/16/17 0542 05/16/17 0734 05/16/17 1424  BP:  123/62    Pulse:  72    Resp:  18    Temp:  98.7 F (37.1 C)    TempSrc:  Oral    SpO2: 98% 99% 98% 97%  Weight:      Height:        Intake/Output Summary (Last 24 hours) at 05/16/17 1559 Last data filed at 05/16/17 0858  Gross per 24 hour  Intake              360 ml  Output                0 ml  Net              360 ml   Filed Weights   05/11/17 1123  Weight: 56.7 kg (125 lb)    Examination:  General exam: Alert, awake, No distress Respiratory system: Clear to auscultation. Respiratory effort normal. Cardiovascular system:RRR. No murmurs, rubs, gallops. Gastrointestinal system: Abdomen is nondistended, soft and nontender. No organomegaly or masses felt. Normal bowel sounds heard. Central nervous system:  No focal neurological deficits. Extremities: Left shoulder still tender when palpated and with range of motion Skin: No rashes, lesions or ulcers Psychiatry: Confused, pleasant        Data Reviewed: I have personally reviewed following labs and imaging studies  CBC:  Recent Labs Lab 05/11/17 1220 05/12/17 0207 05/14/17 0422  05/15/17 0611  WBC 11.2* 10.7* 7.1 6.2  NEUTROABS 9.3*  --   --   --   HGB 12.7 11.6* 9.9* 11.6*  HCT 39.0 35.4* 29.8* 35.7*  MCV 88.2 88.5 87.1 87.9  PLT 299 251 244 300   Basic Metabolic Panel:  Recent Labs Lab 05/11/17 1220 05/12/17 0207 05/14/17 0422 05/15/17 0611  NA 144 143 137 138  K 3.5 3.4* 3.5 4.3  CL 103 106 104 103  CO2 30 29 25 27   GLUCOSE 162* 127* 96 111*  BUN 24* 25* 16 18  CREATININE 1.53* 1.42* 1.52* 1.46*  CALCIUM 9.6 8.6* 8.0* 8.6*   GFR: Estimated  Creatinine Clearance: 21.2 mL/min (A) (by C-G formula based on SCr of 1.46 mg/dL (H)). Liver Function Tests:  Recent Labs Lab 05/11/17 1220  AST 39  ALT 28  ALKPHOS 66  BILITOT 1.0  PROT 7.7  ALBUMIN 3.6    Recent Labs Lab 05/11/17 1220  LIPASE 28   No results for input(s): AMMONIA in the last 168 hours. Coagulation Profile: No results for input(s): INR, PROTIME in the last 168 hours. Cardiac Enzymes:  Recent Labs Lab 05/11/17 1220 05/11/17 1405 05/11/17 1959 05/12/17 0207  TROPONINI 0.04* 0.03* 0.05* 0.05*   BNP (last 3 results) No results for input(s): PROBNP in the last 8760 hours. HbA1C: No results for input(s): HGBA1C in the last 72 hours. CBG: No results for input(s): GLUCAP in the last 168 hours. Lipid Profile: No results for input(s): CHOL, HDL, LDLCALC, TRIG, CHOLHDL, LDLDIRECT in the last 72 hours. Thyroid Function Tests: No results for input(s): TSH, T4TOTAL, FREET4, T3FREE, THYROIDAB in the last 72 hours. Anemia Panel: No results for input(s): VITAMINB12, FOLATE, FERRITIN, TIBC, IRON, RETICCTPCT in the last 72 hours. Sepsis Labs:  Recent Labs Lab 05/11/17 1255  LATICACIDVEN 1.3    Recent Results (from the past 240 hour(s))  Culture, blood (Routine X 2) w Reflex to ID Panel     Status: None (Preliminary result)   Collection Time: 05/11/17  4:49 PM  Result Value Ref Range Status   Specimen Description BLOOD RIGHT HAND  Final   Special Requests   Final    BOTTLES DRAWN AEROBIC AND ANAEROBIC Blood Culture results may not be optimal due to an inadequate volume of blood received in culture bottles   Culture NO GROWTH 4 DAYS  Final   Report Status PENDING  Incomplete  Culture, blood (Routine X 2) w Reflex to ID Panel     Status: None (Preliminary result)   Collection Time: 05/11/17  4:49 PM  Result Value Ref Range Status   Specimen Description BLOOD RIGHT HAND  Final   Special Requests   Final    BOTTLES DRAWN AEROBIC AND ANAEROBIC Blood Culture  results may not be optimal due to an inadequate volume of blood received in culture bottles   Culture NO GROWTH 4 DAYS  Final   Report Status PENDING  Incomplete  Culture, Urine     Status: None   Collection Time: 05/11/17  4:49 PM  Result Value Ref Range Status   Specimen Description URINE, CATHETERIZED  Final   Special Requests NONE  Final   Culture   Final    NO GROWTH Performed at [facility], [address] [place], [place] [address]    Report Status 05/13/2017 FINAL  Final  Culture, body fluid-bottle     Status: None (Preliminary result)   Collection Time: 05/14/17 12:02 PM  Result Value Ref Range  Status   Specimen Description SHOULDER SYNOVIAL  Final   Special Requests BOTTLES DRAWN AEROBIC ONLY 2CC  Final   Culture NO GROWTH < 24 HOURS  Final   Report Status PENDING  Incomplete  Gram stain     Status: None   Collection Time: 05/14/17 12:02 PM  Result Value Ref Range Status   Specimen Description SYNOVIAL  Final   Special Requests NONE  Final   Gram Stain   Final    NO ORGANISMS SEEN WBC SEEN WBC PRESENT,BOTH PMN AND MONONUCLEAR CYTOSPIN SMEAR    Report Status 05/14/2017 FINAL  Final         Radiology Studies: No results found.      Scheduled Meds: . aspirin EC  81 mg Oral Daily  . diclofenac sodium  2 g Topical QID  . guaiFENesin  1,200 mg Oral BID  . heparin  5,000 Units Subcutaneous Q8H  . ipratropium-albuterol  3 mL Nebulization Q6H WA  . [START ON 05/17/2017] levofloxacin  750 mg Oral Q48H  . levothyroxine  25 mcg Oral QAC breakfast  . memantine  5 mg Oral Daily  . predniSONE  40 mg Oral Q breakfast  . simvastatin  40 mg Oral q1800   Continuous Infusions:    LOS: 5 days    Time spent: 25 minutes    Amneet Cendejas, MD [facility] Pager [phone]  If 7PM-7AM, please contact night-coverage [email or web address] Password [email or web address] 05/16/2017, 3:59 PM

## 2017-05-17 MED ORDER — IPRATROPIUM-ALBUTEROL 0.5-2.5 (3) MG/3ML IN SOLN: 3.0000 mL | Freq: Four times a day (QID) | RESPIRATORY_TRACT | Status: AC | PRN

## 2017-05-17 MED ORDER — PREDNISONE 20 MG PO TABS: ORAL_TABLET | ORAL | Status: AC

## 2017-05-17 MED ORDER — GUAIFENESIN ER 600 MG PO TB12: 600.0000 mg | ORAL_TABLET | Freq: Two times a day (BID) | ORAL | Status: AC

## 2017-05-17 NOTE — Care Management Important Message (Signed)
Important Message  Patient Details  Name: Autumn DykesJanet M Gebbia MRN: [id number] Date of Birth: 01/07/1925   Medicare Important Message Given:  Yes    Malcolm MetroChildress, Veron Senner Demske, RN 05/17/2017, 10:41 AM

## 2017-05-17 NOTE — Progress Notes (Signed)
Called report to [facility] nurse. Patient sent with IV removed and daughter took personal belongings.

## 2017-05-17 NOTE — Progress Notes (Signed)
Patient is medically stable for discharge to [facility] Confirmed with patient daughter at bedside plans for transport and she is in agreement. Requests EMS for transport.  LCSW has sent over all paperwork an information for admission. Confirmed with admissions plan to admit and in agreement.  No other needs. Report:  [phone] To [facility].  Deretha EmoryHannah Marquetta Weiskopf LCSW, MSW Clinical Social Work: Optician, dispensingystem Wide Float Coverage for :  [phone]
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
## 2017-05-17 NOTE — Care Management Note (Signed)
Case Management Note  Patient Details  Name: Autumn DykesJanet M Clack MRN: [id number] Date of Birth: [birth year]   Expected Discharge Date:   05/17/2017               Expected Discharge Plan:  Skilled Nursing Facility  In-House Referral:  Clinical Social Work  Discharge planning Services  CM Consult  Post Acute Care Choice:    Choice offered to:     Status of Service:  Completed, signed off  Additional Comments: Anticipate DC to SNF today. CSW to make placement arrangements.   Malcolm Metrohildress, Haydan Mansouri Demske, RN 05/17/2017, 10:42 AM

## 2017-05-19 LAB — CULTURE, BODY FLUID W GRAM STAIN -BOTTLE: Culture: NO GROWTH

## 2017-10-28 DEATH — deceased

## 2018-03-17 IMAGING — RF DG FLUORO GUIDE NDL PLC/BX
2 series · 2 of 2 positions shown · IV contrast (isovue)
Comparison: none

CLINICAL DATA: [AGE] female with left shoulder pain and
swelling

EXAM:
LEFT SHOULDER ASPIRATION UNDER FLUOROSCOPY
TECHNIQUE: An appropriate skin entrance site was determined. The site was
marked, prepped with Betadine, draped in the usual sterile fashion,
and infiltrated locally with buffered Lidocaine. 22 gauge spinal
needle was advanced to the superomedial margin of the humeral head
under intermittent fluoroscopy. 2 ml of Isovue 300 was injected to
confirm placement in the joint space. Subsequently 3 cc sterile
saline was injected into the joint space and aspirated.
FLUOROSCOPY TIME:  Fluoroscopy Time:  1 minutes and 30 seconds

[Series 1: cp_standard · 0.17mm/px · 1 of 1 slices shown (1 of 2)]
[im 1/1]
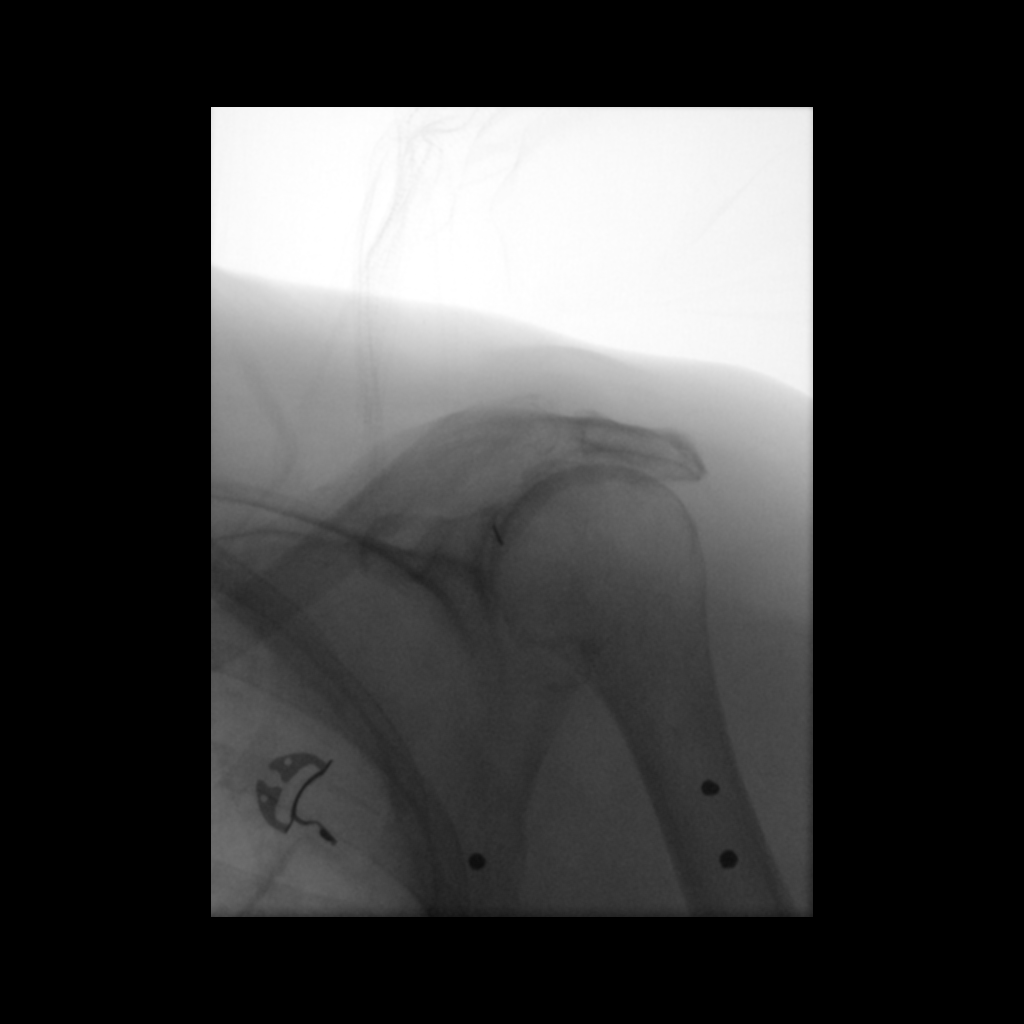

[Series 2: cp_standard · 0.17mm/px · 1 of 1 slices shown (2 of 2)]
[im 1/1]
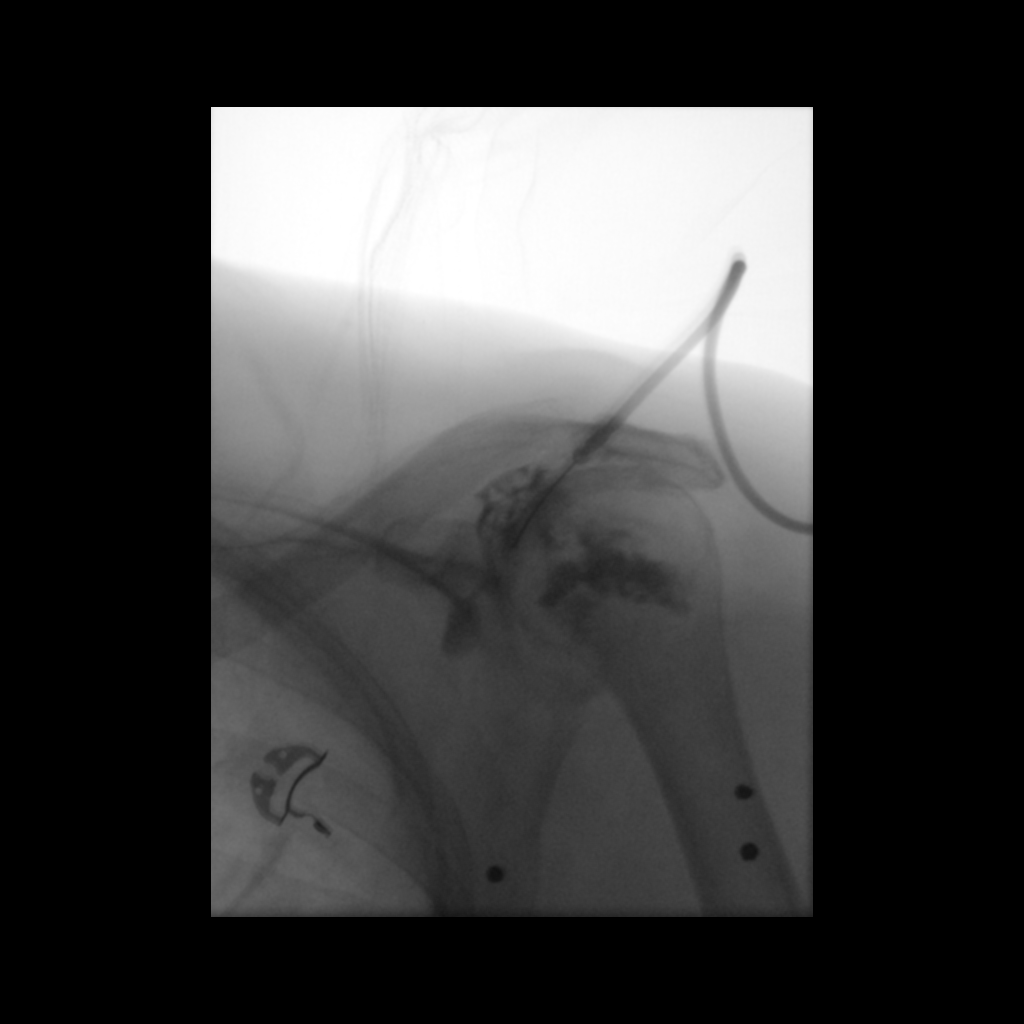

[2 of 2 positions shown; findings below may reference images not displayed]

FINDINGS: Yellowish fluid was aspirated from the joint space and sent to the
laboratory for testing and analysis per orders of the primary
medical team.
IMPRESSION: Technically successful left shoulder aspiration.
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
## 2018-08-16 ENCOUNTER — Encounter: Payer: Self-pay | Admitting: Gastroenterology

## 2018-08-22 ENCOUNTER — Telehealth: Payer: Self-pay | Admitting: Gastroenterology

## 2018-08-22 NOTE — Telephone Encounter (Signed)
REVIEWED-NO ADDITIONAL RECOMMENDATIONS. 

## 2018-08-22 NOTE — Telephone Encounter (Signed)
Noted  

## 2018-08-22 NOTE — Telephone Encounter (Signed)
Pt is deceased.
